# Patient Record
Sex: Female | Born: 1995 | Race: Black or African American | Hispanic: No | Marital: Single | Smoking: Current some day smoker
Health system: Southern US, Community
[De-identification: ages and names within clinical notes are randomized; demographics above are authoritative.]

## PROBLEM LIST (undated history)

## (undated) DIAGNOSIS — N739 Female pelvic inflammatory disease, unspecified: Secondary | ICD-10-CM

## (undated) DIAGNOSIS — B009 Herpesviral infection, unspecified: Secondary | ICD-10-CM

---
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
## 2017-11-06 ENCOUNTER — Other Ambulatory Visit: Payer: Self-pay

## 2017-11-06 ENCOUNTER — Emergency Department: Payer: 59

## 2017-11-06 ENCOUNTER — Encounter: Payer: Self-pay | Admitting: Emergency Medicine

## 2017-11-06 ENCOUNTER — Emergency Department
Admission: EM | Admit: 2017-11-06 | Discharge: 2017-11-06 | Disposition: A | Discharge status: Home / Self Care | Payer: 59 | Attending: Emergency Medicine | Admitting: Emergency Medicine

## 2017-11-06 DIAGNOSIS — R4182 Altered mental status, unspecified: Secondary | ICD-10-CM | POA: Insufficient documentation

## 2017-11-06 DIAGNOSIS — F1910 Other psychoactive substance abuse, uncomplicated: Secondary | ICD-10-CM

## 2017-11-06 DIAGNOSIS — F1210 Cannabis abuse, uncomplicated: Secondary | ICD-10-CM | POA: Insufficient documentation

## 2017-11-06 LAB — COMPREHENSIVE METABOLIC PANEL
ALK PHOS: 98 U/L (ref 38–126)
ALT: 14 U/L (ref 14–54)
ANION GAP: 6 (ref 5–15)
AST: 19 U/L (ref 15–41)
Albumin: 4.6 g/dL (ref 3.5–5.0)
BILIRUBIN TOTAL: 1.3 mg/dL — AB (ref 0.3–1.2)
BUN: 10 mg/dL (ref 6–20)
CALCIUM: 9.3 mg/dL (ref 8.9–10.3)
CO2: 26 mmol/L (ref 22–32)
Chloride: 108 mmol/L (ref 101–111)
Creatinine, Ser: 1.14 mg/dL — ABNORMAL HIGH (ref 0.44–1.00)
GLUCOSE: 116 mg/dL — AB (ref 65–99)
Potassium: 4.1 mmol/L (ref 3.5–5.1)
Sodium: 140 mmol/L (ref 135–145)
TOTAL PROTEIN: 7.9 g/dL (ref 6.5–8.1)

## 2017-11-06 LAB — CBC
HEMATOCRIT: 43.9 % (ref 35.0–47.0)
Hemoglobin: 14.7 g/dL (ref 12.0–16.0)
MCH: 29.6 pg (ref 26.0–34.0)
MCHC: 33.4 g/dL (ref 32.0–36.0)
MCV: 88.5 fL (ref 80.0–100.0)
PLATELETS: 232 10*3/uL (ref 150–440)
RBC: 4.97 MIL/uL (ref 3.80–5.20)
RDW: 14.1 % (ref 11.5–14.5)
WBC: 7.4 10*3/uL (ref 3.6–11.0)

## 2017-11-06 LAB — URINE DRUG SCREEN, QUALITATIVE (ARMC ONLY)
Amphetamines, Ur Screen: NOT DETECTED
BARBITURATES, UR SCREEN: NOT DETECTED
BENZODIAZEPINE, UR SCRN: NOT DETECTED
Cannabinoid 50 Ng, Ur ~~LOC~~: POSITIVE — AB
Cocaine Metabolite,Ur ~~LOC~~: POSITIVE — AB
MDMA (Ecstasy)Ur Screen: NOT DETECTED
METHADONE SCREEN, URINE: NOT DETECTED
Opiate, Ur Screen: NOT DETECTED
PHENCYCLIDINE (PCP) UR S: NOT DETECTED
Tricyclic, Ur Screen: NOT DETECTED

## 2017-11-06 LAB — MAGNESIUM: MAGNESIUM: 2.1 mg/dL (ref 1.7–2.4)

## 2017-11-06 LAB — ACETAMINOPHEN LEVEL

## 2017-11-06 LAB — HCG, QUANTITATIVE, PREGNANCY: hCG, Beta Chain, Quant, S: <1 m[IU]/mL (ref <5)

## 2017-11-06 LAB — ETHANOL

## 2017-11-06 LAB — POCT PREGNANCY, URINE: Preg Test, Ur: NEGATIVE

## 2017-11-06 LAB — SALICYLATE LEVEL: Salicylate Lvl: <7 mg/dL (ref 2.8–30.0)

## 2017-11-06 MED ORDER — SODIUM CHLORIDE 0.9 % IV BOLUS: 500.0000 mL | Freq: Once | INTRAVENOUS | Status: DC

## 2017-11-06 NOTE — ED Notes (Signed)
Pt sitting up eating a sandwich tray at this time. No signs of distress.

## 2017-11-06 NOTE — BH Assessment (Signed)
Writer unable to complete TTS Consult. Patient is too lethargic to participate.

## 2017-11-06 NOTE — ED Provider Notes (Signed)
[facility] Emergency Department Provider Note   ____________________________________________   First MD Initiated Contact with Patient 11/06/17 1547     (approximate)  I have reviewed the triage vital signs and the nursing notes.   HISTORY  Chief Complaint Altered Mental Status  EM caveat: Patient somnolent, seems to have poor recollection but is able to provide some history.  History is denoted as what she gives me  HPI Casey Underwood is a 22 y.o. female reports that for the last day she has been eating food that [place] marijuana laced in it.  She reports she was with her friends last evening, and then also this morning she ate food that her friends had baked with marijuana in it.  She also reports that she was drinking alcohol quite a bit last night.  Mom denies pregnancy.  Denies being in any pain.  Reports she just feels very tired.  She denies that she was trying to hurt herself.  Denies suicidal ideation.  Denies hearing voices.  She reports that she and her friends eat "edibles" in order to get high.  She did so this morning as well. Was in a rice krispy bar.  Past Medical History:  Diagnosis Date  . Pelvic inflammatory disease     There are no active problems to display for this patient.   History reviewed. No pertinent surgical history.  Prior to Admission medications   Medication Sig Start Date End Date Taking? Authorizing Provider  magic mouthwash w/lidocaine SOLN Take 5 mLs by mouth 4 (four) times daily. 12/31/15   Cuthriell, Delorise Royals, PA-C    Allergies Patient has no known allergies.  No family history on file.  Social History Social History   Tobacco Use  . Smoking status: Current Some Day Smoker  . Smokeless tobacco: Never Used  Substance Use Topics  . Alcohol use: Yes  . Drug use: Not on file    Review of Systems Constitutional: Denies recent illness.  Denies having a headache. Eyes: No visual changes. ENT: No sore  throat.  Denies neck pain Cardiovascular: Denies chest pain. Respiratory: Denies shortness of breath. Gastrointestinal: No abdominal pain.  Denies pregnancy. Genitourinary: Negative for dysuria. Musculoskeletal: No pain in any muscles or back. Skin: Negative for rash.  Denies itching. Neurological: Negative for headaches.  Reports that she does feel very tired and wants to sleep.   ____________________________________________   PHYSICAL EXAM:  VITAL SIGNS: ED Triage Vitals  Enc Vitals Group     BP 11/06/17 1417 125/79     Pulse Rate 11/06/17 1417 (!) 103     Resp 11/06/17 1417 18     Temp 11/06/17 1417 99 F (37.2 C)     Temp Source 11/06/17 1417 Oral     SpO2 11/06/17 1417 100 %     Weight 11/06/17 1418 108 lb (49 kg)     Height 11/06/17 1418 4\' 9"  (1.448 m)     Head Circumference --      Peak Flow --      Pain Score --      Pain Loc --      Pain Edu? --      Excl. in GC? --     Constitutional: Alert and oriented with an spoken to, but prefers to rest on her side and appears very fatigued and somnolent overall. Well appearing and in no acute distress. Eyes: Conjunctivae are normal. Head: Atraumatic. Nose: No congestion/rhinnorhea. Mouth/Throat: Mucous membranes are moist. Neck:  No stridor.   Cardiovascular: Normal rate, regular rhythm. Grossly normal heart sounds.  Good peripheral circulation. Respiratory: Normal respiratory effort.  No retractions. Lungs CTAB. Gastrointestinal: Soft and nontender. No distention.  Does not appear gravid. Musculoskeletal: No lower extremity tenderness nor edema.  No lacerations or injuries on extremities. Neurologic: Somewhat soft spoken.  Appears very tired.  No focal abnormalities.  No gross focal neurologic deficits are appreciated.  The patient does exhibit nystagmus when looking in lateral fields.  She also has a slight "roving" appearance of her eye movements when trying to focus forward. Skin:  Skin is warm, dry and intact. No  rash noted. Psychiatric: Mood and affect are calm and sedated.  Does not appear to be responding to external stimuli.  Denies desire to harm herself or anyone else.  She is very frank and reports that she was eating at a bolus to attempt to get high which she thought contained only marijuana.  She denies snorting, smoking or injection of any other medication except does report using alcohol last night as well. ____________________________________________   LABS (all labs ordered are listed, but only abnormal results are displayed)  Labs Reviewed  COMPREHENSIVE METABOLIC PANEL - Abnormal; Notable for the following components:      Result Value   Glucose, Bld 116 (*)    Creatinine, Ser 1.14 (*)    Total Bilirubin 1.3 (*)    All other components within normal limits  ACETAMINOPHEN LEVEL - Abnormal; Notable for the following components:   Acetaminophen (Tylenol), Serum <10 (*)    All other components within normal limits  URINE DRUG SCREEN, QUALITATIVE ([facility] ONLY) - Abnormal; Notable for the following components:   Cocaine Metabolite,Ur [place] POSITIVE (*)    Cannabinoid 50 Ng, Ur  POSITIVE (*)    All other components within normal limits  ETHANOL  SALICYLATE LEVEL  CBC  HCG, QUANTITATIVE, PREGNANCY  MAGNESIUM  URINALYSIS, COMPLETE (UACMP) WITH MICROSCOPIC  POC URINE PREG, ED  POCT PREGNANCY, URINE   ____________________________________________  EKG  Reviewed and entered by me at 1445 Heart rate 101 QRS 60 QTC 470 Sinus tachycardia, mild prolongation of the QT interval ____________________________________________  RADIOLOGY  CT the head negative for acute ____________________________________________   PROCEDURES  Procedure(s) performed: None  Procedures  Critical Care performed: No ____________________________________________   INITIAL IMPRESSION / ASSESSMENT AND PLAN / ED COURSE  Pertinent labs & imaging results that were available during my care of the patient  were reviewed by me and considered in my medical decision making (see chart for details).  Patient presents for altered mental status, somnolence after evidently taking edible brownie containing what she believed to be marijuana.  She is altered, somnolent, discussed in contact with poison control who advised observation for clearing mental status.  Her hemodynamics are stable, no distress, protecting her airway well, best described as somnolent, some abnormal extraocular movements likely attributable to use of can ambulate containing compound according to poison control reports the side effects can be seen especially in younger individuals using edible cannabinoids.  Unclear how the patient got cocaine in her system, she does not admit to cocaine use.  Exposed possible could be ingested, she does not know what exactly she ate in her edible brownie.    ----------------------------------------- 8:45 PM on 11/06/2017 -----------------------------------------  The patient is resting comfortably, has eaten a couple of sandwiches and chips.  She is fully alert and oriented.  Able to ambulate without difficulty.  She has normal ocular movements now,  speaks in full and fluent sentences with clear voice.  She understands and tells me that she knows that she should not be taking drugs, and she was doing so to get high with friends.  Denies any ongoing symptoms.  Appears appropriate for discharge, no evidence of ongoing intoxication.  Counseled patient, her mother is also at the bedside who the patient gave me permission to discuss with, and I advised them on the risks and dangers of using drugs or illegal substances.  Patient is agreeable to follow-up with residential treatment services.  Mother will be taking her home  Return precautions and treatment recommendations and follow-up discussed with the patient who is agreeable with the plan.   ____________________________________________   FINAL CLINICAL  IMPRESSION(S) / ED DIAGNOSES  Final diagnoses:  Polysubstance abuse (HCC)      NEW MEDICATIONS STARTED DURING THIS VISIT:  New Prescriptions   No medications on file     Note:  This document was prepared using Dragon voice recognition software and may include unintentional dictation errors.     Sharyn Creamer, MD 11/06/17 2050

## 2017-11-06 NOTE — ED Notes (Signed)
Mom at bedside asking for results. Pt verbalized permission to tell mom all of her test results. Spoke with pt about drugs and the dangers of such and to not squander her opportunity. Pt continues to drink fluid.

## 2017-11-06 NOTE — ED Triage Notes (Signed)
FIRST NURSE NOTE-mom here with pt.  Mom wants pt admitted because she ate a rice krispie treat with weed in it.  Mom does not like friend she hangs out with and feels like her self esteem needs to be increased.  NAD. No vomiting or side effects from food.

## 2017-11-06 NOTE — ED Notes (Signed)
Per Revonda StandardAllison at poison control, talk to mother about any medications in the home that anyone at home takes.  Otherwise, "labs are unimpressive".  Poison control staff was relieved that salicylate levels were wnl.  Edible marijuana's can be stronger and can cause some lethargy and confusion.  Horizontal and Vertical nystagmus in pediatric patients has been recorded.  If patient doesn't improve, we can call poison control back.

## 2017-11-06 NOTE — ED Notes (Signed)
While getting VS, pt mom states pt "needs time away from everyone so she can think about herself and her son."

## 2017-11-06 NOTE — ED Notes (Signed)
Mom is dropping of the pt, gave a phone number to call once daughter is being discharged. [phone] Moms name: Gabriel RungMonique Bookercrisp

## 2017-11-06 NOTE — ED Triage Notes (Signed)
Mom says patient went out this am, and she had eaten a rice crispie treat.  Mom is concerned that there was something more than weed in it, as patient is acting strange.  Pt is awake to verbal, but she closes her eyes a lot in traige.

## 2017-11-06 NOTE — ED Notes (Signed)
Pt given dinner tray at this time, pt up and eating with NAD noted

## 2017-11-06 NOTE — ED Notes (Signed)
Md states he does want to watch pt for a few more hours. Pt is alert but still high. Pt has eaten multiple times, drinking PO fluids and her VSS.

## 2017-11-06 NOTE — ED Notes (Signed)
PT is refusing IV, MD awre

## 2017-11-06 NOTE — ED Notes (Signed)
This RN called poison control at this time.

## 2017-11-06 NOTE — ED Notes (Addendum)
Patient states she ate several rice crispies with "weed butter" and since then has felt like she is "drowning."  Patient's eyes are wide and patient appears tired.  Patient denies SI and HI.  Patient denies fear of anyone hurting her.

## 2017-11-06 NOTE — Discharge Instructions (Signed)

## 2017-11-06 NOTE — ED Notes (Addendum)
Pt continues to sleep, NSR on monitor. Pt responds when stimulated, VSS . MD awre

## 2018-04-08 ENCOUNTER — Emergency Department
Admission: EM | Admit: 2018-04-08 | Discharge: 2018-04-08 | Disposition: A | Discharge status: Home / Self Care | Payer: Medicaid Other | Attending: Emergency Medicine | Admitting: Emergency Medicine

## 2018-04-08 ENCOUNTER — Other Ambulatory Visit: Payer: Self-pay

## 2018-04-08 DIAGNOSIS — F17210 Nicotine dependence, cigarettes, uncomplicated: Secondary | ICD-10-CM | POA: Insufficient documentation

## 2018-04-08 DIAGNOSIS — R21 Rash and other nonspecific skin eruption: Secondary | ICD-10-CM

## 2018-04-08 LAB — BASIC METABOLIC PANEL
Anion gap: 7 (ref 5–15)
BUN: 17 mg/dL (ref 6–20)
CALCIUM: 9.1 mg/dL (ref 8.9–10.3)
CO2: 25 mmol/L (ref 22–32)
CREATININE: 1.16 mg/dL — AB (ref 0.44–1.00)
Chloride: 107 mmol/L (ref 98–111)
Glucose, Bld: 78 mg/dL (ref 70–99)
Potassium: 3.4 mmol/L — ABNORMAL LOW (ref 3.5–5.1)
SODIUM: 139 mmol/L (ref 135–145)

## 2018-04-08 LAB — WET PREP, GENITAL
Clue Cells Wet Prep HPF POC: NONE SEEN
SPERM: NONE SEEN
TRICH WET PREP: NONE SEEN
YEAST WET PREP: NONE SEEN

## 2018-04-08 LAB — CBC WITH DIFFERENTIAL/PLATELET
BASOS PCT: 1 %
Basophils Absolute: 0.1 10*3/uL (ref 0–0.1)
EOS PCT: 5 %
Eosinophils Absolute: 0.3 10*3/uL (ref 0–0.7)
HCT: 43.4 % (ref 35.0–47.0)
Hemoglobin: 14.8 g/dL (ref 12.0–16.0)
LYMPHS ABS: 2.8 10*3/uL (ref 1.0–3.6)
Lymphocytes Relative: 39 %
MCH: 30.3 pg (ref 26.0–34.0)
MCHC: 34.2 g/dL (ref 32.0–36.0)
MCV: 88.5 fL (ref 80.0–100.0)
MONOS PCT: 11 %
Monocytes Absolute: 0.8 10*3/uL (ref 0.2–0.9)
NEUTROS PCT: 44 %
Neutro Abs: 3.3 10*3/uL (ref 1.4–6.5)
PLATELETS: 224 10*3/uL (ref 150–440)
RBC: 4.9 MIL/uL (ref 3.80–5.20)
RDW: 13.6 % (ref 11.5–14.5)
WBC: 7.2 10*3/uL (ref 3.6–11.0)

## 2018-04-08 LAB — URINALYSIS, COMPLETE (UACMP) WITH MICROSCOPIC
BACTERIA UA: NONE SEEN
Bilirubin Urine: NEGATIVE
Glucose, UA: NEGATIVE mg/dL
Hgb urine dipstick: NEGATIVE
Ketones, ur: 5 mg/dL — AB
Leukocytes, UA: NEGATIVE
Nitrite: NEGATIVE
PROTEIN: 30 mg/dL — AB
SPECIFIC GRAVITY, URINE: 1.029 (ref 1.005–1.030)
pH: 5 (ref 5.0–8.0)

## 2018-04-08 LAB — CHLAMYDIA/NGC RT PCR (ARMC ONLY)
Chlamydia Tr: DETECTED — AB
N gonorrhoeae: DETECTED — AB

## 2018-04-08 LAB — RAPID HIV SCREEN (HIV 1/2 AB+AG)
HIV 1/2 ANTIBODIES: NONREACTIVE
HIV-1 P24 ANTIGEN - HIV24: NONREACTIVE

## 2018-04-08 LAB — PREGNANCY, URINE: Preg Test, Ur: NEGATIVE

## 2018-04-08 MED ORDER — DOXYCYCLINE HYCLATE 100 MG PO CAPS: 100.0000 mg | ORAL_CAPSULE | Freq: Two times a day (BID) | ORAL | 0 refills | Status: AC

## 2018-04-08 MED ORDER — DOXYCYCLINE HYCLATE 100 MG PO TABS
100.0000 mg | ORAL_TABLET | Freq: Once | ORAL | Status: AC
  Administered 2018-04-08: 100 mg via ORAL
  Filled 2018-04-08: qty 1

## 2018-04-08 NOTE — ED Triage Notes (Signed)
Patient reports she has this burning, picking sensation on her skin and unexplained sores over her body.  Patient states she can rub her skin and this "substance" come out.

## 2018-04-08 NOTE — ED Provider Notes (Signed)
[facility] Emergency Department Provider Note ____________________________________________   First MD Initiated Contact with Patient 04/08/18 2039     (approximate)  I have reviewed the triage vital signs and the nursing notes.   HISTORY  Chief Complaint Unknown "sores"  HPI Casey Underwood is a 22 y.o. 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  She is unsure of when the rash started exactly.  However, she says that it is diffusely to her body, worse to the right posterior thigh.  Says that it feels like things are "crawling all over her and she is seeing "white things" in her ear on her head as well as her pubic hair.  Denies any itching or pain.  Says that she is also having vaginal discharge.  Says that she was treated for herpes outbreak [facility] January [facility] this [facility] and says that she did not test positive for any other STDs at that time.  However, she says that she has had new sexual partner since.   Past Medical History:  Diagnosis Date  . Pelvic inflammatory disease     There are no active problems to display for this patient.   No past surgical history on file.  Prior to Admission medications   Not on File    Allergies Patient has no known allergies.  No family history on file.  Social History Social History   Tobacco Use  . Smoking status: Current Some Day Smoker  . Smokeless tobacco: Never Used  Substance Use Topics  . Alcohol use: Yes  . Drug use: Not on file    Review of Systems  Constitutional: No fever/chills Eyes: No visual changes. ENT: No sore throat. Cardiovascular: Denies chest pain. Respiratory: Denies shortness of breath. Gastrointestinal: No abdominal pain.  No nausea, no vomiting.  No diarrhea.  No constipation. Genitourinary: Negative for dysuria. Musculoskeletal: Negative for back  pain. Skin: As above Neurological: Negative for headaches, focal weakness or numbness.   ____________________________________________   PHYSICAL EXAM:  VITAL SIGNS: ED Triage Vitals [04/08/18 2013]  Enc Vitals Group     BP 106/73     Pulse Rate (!) 103     Resp 18     Temp 97.9 F (36.6 C)     Temp Source Oral     SpO2 100 %     Weight 98 lb (44.5 kg)     Height 4\' 9"  (1.448 m)     Head Circumference      Peak Flow      Pain Score      Pain Loc      Pain Edu?      Excl. in GC?     Constitutional: Alert and oriented.  Patient tearful.  Says that she is nervous. Eyes: Conjunctivae are normal.  Head: Atraumatic.  I do not see any nits or bite marks to the posterior neck.  No swollen posterior nodes to the neck.  I do not see the small weight flecks of the patient was referring to on her scalp. Nose: No congestion/rhinnorhea. Mouth/Throat: Mucous membranes are moist.  No intraoral lesions. Neck: No stridor.   Cardiovascular: Normal rate, regular rhythm. Grossly normal heart sounds.  Heart rate 92 bpm in the room. Respiratory: Normal respiratory effort.  No retractions. Lungs CTAB. Gastrointestinal: Soft and nontender. No distention. Genitourinary: Several small, 0.5 to  1 cm ulcerated lesions without any vesicles on the perineum.  No surrounding erythema or induration.  Speculum exam with a very small amount of white discharge.  Bimanual exam without CMT, uterine or nor adnexal tenderness nor masses. Musculoskeletal: No lower extremity tenderness nor edema.  No joint effusions. Neurologic:  Normal speech and language. No gross focal neurologic deficits are appreciated. Skin: Multiple 0.5 to 1 cm lesions that appear to be unroofed with a small amount of shiny, subdermal tissue in the middle of a keratinized ring.  There is no exudate.  No excoriations.  No lesions on the palms and soles but the patient does say that she noticed lesions on the palms previously. Psychiatric: Mood  and affect are normal. Speech and behavior are normal.  ____________________________________________   LABS (all labs ordered are listed, but only abnormal results are displayed)  Labs Reviewed  WET PREP, GENITAL - Abnormal; Notable for the following components:      Result Value   WBC, Wet Prep HPF POC FEW (*)    All other components within normal limits  BASIC METABOLIC PANEL - Abnormal; Notable for the following components:   Potassium 3.4 (*)    Creatinine, Ser 1.16 (*)    All other components within normal limits  URINALYSIS, COMPLETE (UACMP) WITH MICROSCOPIC - Abnormal; Notable for the following components:   Color, Urine YELLOW (*)    APPearance HAZY (*)    Ketones, ur 5 (*)    Protein, ur 30 (*)    All other components within normal limits  CHLAMYDIA/NGC RT PCR ([facility] ONLY)  CBC WITH DIFFERENTIAL/PLATELET  RAPID HIV SCREEN (HIV 1/2 AB+AG)  PREGNANCY, URINE  RPR  POC URINE PREG, ED   ____________________________________________  EKG   ____________________________________________  RADIOLOGY   ____________________________________________   PROCEDURES  Procedure(s) performed:   Procedures  Critical Care performed:   ____________________________________________   INITIAL IMPRESSION / ASSESSMENT AND PLAN / ED COURSE  Pertinent labs & imaging results that were available during my care of the patient were reviewed by me and considered in my medical decision making (see chart for details).  DDX: Secondary syphilis, folliculitis, disseminated herpes, HIV, cervicitis, trichomonas As part of my medical decision making, I reviewed the following data within the electronic MEDICAL RECORD NUMBER Notes from prior ED visits  ----------------------------------------- 11:03 PM on 04/08/2018 -----------------------------------------  Patient at this time resting without any distress.  RPR still pending but the rest of the work-up is reassuring with negative HIV  testing on the rapid tests.  Normal CBC.  Gonorrhea and Chlamydia are pending but there are only few white blood cells on the wet prep.  Possibly secondary syphilis versus an impetigo versus folliculitis.  Patient will be treated with doxycycline which will cover multiple pathologies including those listed.  Patient understands follow-up with primary care.  She is understanding that her syphilis test is still pending.  Will be discharged at this time. ____________________________________________   FINAL CLINICAL IMPRESSION(S) / ED DIAGNOSES  Rash.  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  NEW MEDICATIONS STARTED DURING THIS VISIT:  New Prescriptions   No medications on file     Note:  This document was prepared using Dragon voice recognition software and may include unintentional dictation errors.     Myrna Blazer, MD 04/08/18 [phone]

## 2018-04-08 NOTE — ED Notes (Signed)
Pt with lesions all over body.

## 2018-04-09 ENCOUNTER — Telehealth: Payer: Self-pay | Admitting: Emergency Medicine

## 2018-04-09 LAB — RPR: RPR: NONREACTIVE

## 2018-04-09 NOTE — Telephone Encounter (Addendum)
Called patient to inform of std test results and need for further treatment.  She has positive gonorrhea and chlamydia and needs to return here today for treatment.  Left message. If patient does not call back today will try again tomorrow, but she could be treated at [facility] which will be open tomorrow.  [phone])  Patient called me back.  Says she will go to [facility] now for treatment, but is more concerned about rash.  She will see if she can go to dermatologist.

## 2018-04-17 ENCOUNTER — Emergency Department
Admission: EM | Admit: 2018-04-17 | Discharge: 2018-04-17 | Disposition: A | Discharge status: Home / Self Care | Payer: Medicaid Other | Attending: Emergency Medicine | Admitting: Emergency Medicine

## 2018-04-17 ENCOUNTER — Other Ambulatory Visit: Payer: Self-pay

## 2018-04-17 ENCOUNTER — Encounter: Payer: Self-pay | Admitting: Emergency Medicine

## 2018-04-17 DIAGNOSIS — B853 Phthiriasis: Secondary | ICD-10-CM | POA: Insufficient documentation

## 2018-04-17 DIAGNOSIS — F1410 Cocaine abuse, uncomplicated: Secondary | ICD-10-CM | POA: Insufficient documentation

## 2018-04-17 DIAGNOSIS — F17210 Nicotine dependence, cigarettes, uncomplicated: Secondary | ICD-10-CM | POA: Insufficient documentation

## 2018-04-17 DIAGNOSIS — L988 Other specified disorders of the skin and subcutaneous tissue: Secondary | ICD-10-CM | POA: Insufficient documentation

## 2018-04-17 HISTORY — DX: Herpesviral infection, unspecified: B00.9

## 2018-04-17 MED ORDER — PERMETHRIN 1 % EX LOTN: TOPICAL_LOTION | CUTANEOUS | 0 refills | Status: AC

## 2018-04-17 MED ORDER — PERMETHRIN 5 % EX CREA: 1.0000 "application " | TOPICAL_CREAM | Freq: Once | CUTANEOUS | 0 refills | Status: AC

## 2018-04-17 MED ORDER — CETIRIZINE HCL 10 MG PO TABS: 10.0000 mg | ORAL_TABLET | Freq: Four times a day (QID) | ORAL | 0 refills | Status: DC | PRN

## 2018-04-17 NOTE — ED Provider Notes (Signed)
[facility] Emergency Department Provider Note  ____________________________________________   First MD Initiated Contact with Patient 04/17/18 [phone]     (approximate)  I have reviewed the triage vital signs and the nursing notes.   HISTORY  Chief Complaint Pruritis and Insect Bite   HPI Casey Underwood is a 22 y.o. female who comes to the emergency department with several weeks of diffuse lesions across her arms legs abdomen and chest.  They are intensely itchy.  She was recently seen in our emergency department where she was diagnosed with both gonorrhea and chlamydia and completed a complete course of antibiotics for both.  Her symptoms are unchanged but persistent.  She is concerned because she has found "worms" when she scratches her skin.  She also reports a long-standing history of cocaine abuse although denies recent use.    Past Medical History:  Diagnosis Date  . Herpes simplex   . Pelvic inflammatory disease     There are no active problems to display for this patient.   Past Surgical History:  Procedure Laterality Date  . VAGINAL DELIVERY      Prior to Admission medications   Medication Sig Start Date End Date Taking? Authorizing Provider  cetirizine (ZYRTEC ALLERGY) 10 MG tablet Take 1 tablet (10 mg total) by mouth 4 (four) times daily as needed (itching). 04/17/18   Merrily Brittle, MD  doxycycline (VIBRAMYCIN) 100 MG capsule Take 1 capsule (100 mg total) by mouth 2 (two) times daily for 14 days. 04/08/18 04/22/18  Myrna Blazer, MD  permethrin (PERMETHRIN LICE TREATMENT) 1 % lotion Apply to affected area once 04/17/18 04/16/19  Merrily Brittle, MD    Allergies Patient has no known allergies.  No family history on file.  Social History Social History   Tobacco Use  . Smoking status: Current Some Day Smoker    Types: Cigarettes  . Smokeless tobacco: Never Used  Substance Use Topics  . Alcohol use: Yes  . Drug use: Never      Review of Systems Constitutional: No fever/chills ENT: No sore throat. Cardiovascular: Denies chest pain. Respiratory: Denies shortness of breath. Gastrointestinal: No abdominal pain.  No nausea, no vomiting.   Skin: Positive for rash Neurological: Negative for headaches   ____________________________________________   PHYSICAL EXAM:  VITAL SIGNS: ED Triage Vitals  Enc Vitals Group     BP 04/17/18 0118 119/75     Pulse Rate 04/17/18 0118 (!) 105     Resp 04/17/18 0118 19     Temp 04/17/18 0118 98.9 F (37.2 C)     Temp Source 04/17/18 0118 Oral     SpO2 04/17/18 0118 100 %     Weight 04/17/18 0118 100 lb (45.4 kg)     Height 04/17/18 0118 4\' 9"  (1.448 m)     Head Circumference --      Peak Flow --      Pain Score 04/17/18 0136 5     Pain Loc --      Pain Edu? --      Excl. in GC? --     Constitutional: Fidgeting and appears somewhat hyperactive and uncomfortable Head: Atraumatic.  Pupils slightly dilated Nose: No congestion/rhinnorhea. Mouth/Throat: No trismus Neck: No stridor.   Cardiovascular: Regular rate and rhythm Respiratory: Normal respiratory effort.  No retractions. Neurologic:   No gross focal neurologic deficits are appreciated.  Skin: Excoriations and pick marks across arms legs chest and abdomen.  No overlying cellulitis noted  ____________________________________________  LABS (all labs ordered are listed, but only abnormal results are displayed)  Labs Reviewed - No data to display   __________________________________________  EKG   ____________________________________________  RADIOLOGY   ____________________________________________   DIFFERENTIAL includes but not limited to  Morgellon syndrome, delusional parasitosis, scabies, cellulitis   PROCEDURES  Procedure(s) performed: no  Procedures  Critical Care performed: no  ____________________________________________   INITIAL IMPRESSION / ASSESSMENT AND PLAN / ED  COURSE  Pertinent labs & imaging results that were available during my care of the patient were reviewed by me and considered in my medical decision making (see chart for details).   As part of my medical decision making, I reviewed the following data within the electronic MEDICAL RECORD NUMBER History obtained from family if available, nursing notes, old chart and ekg, as well as notes from prior ED visits.  The patient has diffuse marks across her arms legs chest and back which are consistent with frequent picking possibly from cocaine or amphetamine abuse.  She brings in what she describes as a dead worm that she picked out of her skin however when she does it to me it is clearly a flake of skin.  Concerned that she may have delusional parasitosis and explained it to her.  I think is reasonable to treat her with antihistamines for symptomatic relief and have advised her to stop using illicit drugs.  Shortly prior to discharge her mother showed up and said that the patient has actually also had issues with pubic lice and is requesting treatment so I will add on permethrin.      ____________________________________________   FINAL CLINICAL IMPRESSION(S) / ED DIAGNOSES  Final diagnoses:  Morgellons syndrome  Cocaine abuse (HCC)  Pubic lice      NEW MEDICATIONS STARTED DURING THIS VISIT:  Discharge Medication List as of 04/17/2018  5:07 AM    START taking these medications   Details  cetirizine (ZYRTEC ALLERGY) 10 MG tablet Take 1 tablet (10 mg total) by mouth 4 (four) times daily as needed (itching)., Starting Tue 04/17/2018, Print    permethrin (ELIMITE) 5 % cream Apply 1 application topically once for 1 dose., Starting Tue 04/17/2018, Print    permethrin (PERMETHRIN LICE TREATMENT) 1 % lotion Apply to affected area once, Print         Note:  This document was prepared using Dragon voice recognition software and may include unintentional dictation errors.      Merrily Brittle,  MD 04/20/18 [phone]

## 2018-04-17 NOTE — ED Triage Notes (Signed)
Pt presents to ED with c/o feeling like things are popping outside of her skin and crawling around and biting inside and outside her skin. Pt states she felt something wet on the left side of her face prior to arrival and when she wiped her face she saw a worm that had just come out of her skin. Pt seen for the same in this ED and was told "it was in her head". Pt states something is "really in me and this just confirms it". Pt reports symptoms only seem to happen at night. Pt tearful in triage.

## 2018-04-17 NOTE — Discharge Instructions (Addendum)
Please take cetirizine 10 mg by mouth up to 4 times a day as needed for itching and refrain from using cocaine as the lesions on your skin seem to be related to picking.  Please follow-up with primary care as needed and return to the emergency department for any concerns.  It was a pleasure to take care of you today, and thank you for coming to our emergency department.  If you have any questions or concerns before leaving please ask the nurse to grab me and I'm more than happy to go through your aftercare instructions again.  If you have any concerns once you are home that you are not improving or are in fact getting worse before you can make it to your follow-up appointment, please do not hesitate to call 911 and come back for further evaluation.  Merrily Brittle, MD

## 2018-04-29 ENCOUNTER — Encounter: Payer: Self-pay | Admitting: Emergency Medicine

## 2018-04-29 ENCOUNTER — Emergency Department
Admission: EM | Admit: 2018-04-29 | Discharge: 2018-04-30 | Disposition: A | Discharge status: Home / Self Care | Payer: Self-pay | Attending: Emergency Medicine | Admitting: Emergency Medicine

## 2018-04-29 DIAGNOSIS — F17210 Nicotine dependence, cigarettes, uncomplicated: Secondary | ICD-10-CM | POA: Insufficient documentation

## 2018-04-29 DIAGNOSIS — K625 Hemorrhage of anus and rectum: Secondary | ICD-10-CM

## 2018-04-29 DIAGNOSIS — L281 Prurigo nodularis: Secondary | ICD-10-CM

## 2018-04-29 NOTE — ED Notes (Signed)
Assessment: pt states she has had itching to skin for 6 months. Pt states she thought it was related to the crack cocaine she has been using. Pt states she stopped taking crack cocaine 20 days ago but itching continues. Pt also states she is having rectal bleeding. Pt offering to show staff a video "of me pooping the blood out". Pt instructed to change into a gown for assessment by md.

## 2018-04-29 NOTE — ED Notes (Signed)
Rash   All  Over  Body  Seen 2  Weeks  Ago    Pt was   Seen   sev weeks  For std  Pt took  Her meds   Rash   Itches   Pt taking  Valtrex  And  Doxycyline   Pt  Wants  Help  With  Possible drug withdrawels pt having stress  Wants help

## 2018-04-29 NOTE — ED Triage Notes (Signed)
Patient states that for two months she has had a rash with itching all over. Patient states that it is becoming worse. Patient states that she was told that it might be scabies.

## 2018-04-29 NOTE — ED Notes (Signed)
Pt has multiple  Complaints   Pt needs to be  Moved to  Main ed    Per charge    nurse

## 2018-04-30 MED ORDER — PREDNISONE 10 MG PO TABS: ORAL_TABLET | ORAL | 0 refills | Status: DC

## 2018-04-30 MED ORDER — DOCUSATE SODIUM 100 MG PO CAPS: ORAL_CAPSULE | ORAL | 1 refills | Status: DC

## 2018-04-30 MED ORDER — HYDROXYZINE HCL 25 MG PO TABS: 25.0000 mg | ORAL_TABLET | Freq: Three times a day (TID) | ORAL | 0 refills | Status: DC | PRN

## 2018-04-30 MED ORDER — DOXEPIN HCL 10 MG PO CAPS: 10.0000 mg | ORAL_CAPSULE | Freq: Every evening | ORAL | 1 refills | Status: DC | PRN

## 2018-04-30 NOTE — ED Provider Notes (Signed)
[facility] Emergency Department Provider Note  ____________________________________________   First MD Initiated Contact with Patient 04/29/18 2337     (approximate)  I have reviewed the triage vital signs and the nursing notes.   HISTORY  Chief Complaint Rash    HPI Casey Underwood is a 22 y.o. female with medical history as listed below which includes addiction to crack cocaine, recent treatment for PID, possible pubic lice, and morgellons syndrome who presents for evaluation of a couple of issues.  She reports persistent lesions on all surfaces of her body.  She insists that she does not pick or scratch the lesions but it has been reported by a 1 of the doctors on a prior emergency department visit that she was observed picking at her skin and creating lesions thought to be secondary to her crack cocaine abuse and/or possible Morgellons syndrome.  She has also seen a dermatologist who prescribed her several different medications but she reports that she was unable to fill any of the prescriptions.  The symptoms have persisted and are severe.  However her main reason for presenting tonight is that she has been seeing blood in her stool.  She reports that for about a week she has been intermittently seeing blood in her stool.  She does not have blood when she is not having a bowel movement but she reports having a lot of blood in the stool when she does.  She describes it as bright red.  There is no associated pain.  She is certain it is not vaginal bleeding since she does not have regular periods secondary to an implanted contraceptive device.  She says that nothing in particular makes rectal bleeding better or worse.  She denies abdominal pain, chest pain, shortness of breath, fever/chills, nausea, vomiting, and diarrhea.  She does have chronic constipation.  Past Medical History:  Diagnosis Date  . Herpes simplex   . Pelvic inflammatory disease     There are  no active problems to display for this patient.   Past Surgical History:  Procedure Laterality Date  . VAGINAL DELIVERY      Prior to Admission medications   Medication Sig Start Date End Date Taking? Authorizing Provider  cetirizine (ZYRTEC ALLERGY) 10 MG tablet Take 1 tablet (10 mg total) by mouth 4 (four) times daily as needed (itching). 04/17/18   Merrily Brittleifenbark, Neil, MD  docusate sodium (COLACE) 100 MG capsule Take 1 tablet once or twice daily as needed for constipation 04/30/18   Loleta RoseForbach, Searra Carnathan, MD  doxepin (SINEQUAN) 10 MG capsule Take 1 capsule (10 mg total) by mouth at bedtime as needed (itching). 04/30/18 05/30/18  Loleta RoseForbach, Kalieb Freeland, MD  hydrOXYzine (ATARAX/VISTARIL) 25 MG tablet Take 1 tablet (25 mg total) by mouth 3 (three) times daily as needed for itching. 04/30/18   Loleta RoseForbach, Shakeria Robinette, MD  permethrin (PERMETHRIN LICE TREATMENT) 1 % lotion Apply to affected area once 04/17/18 04/16/19  Merrily Brittleifenbark, Neil, MD  predniSONE (DELTASONE) 10 MG tablet Take 4 tabs (40 mg) PO x 3 days, then take 2 tabs (20 mg) PO x 3 days, then take 1 tab (10 mg) PO x 3 days, then take 1/2 tab (5 mg) PO x 4 days. 04/30/18   Loleta RoseForbach, Earnestine Shipp, MD    Allergies Patient has no known allergies.  No family history on file.  Social History Social History   Tobacco Use  . Smoking status: Current Some Day Smoker    Types: Cigarettes  . Smokeless tobacco: Never  Used  Substance Use Topics  . Alcohol use: Yes  . Drug use: Never    Review of Systems Constitutional: No fever/chills Eyes: No visual changes. ENT: No sore throat. Cardiovascular: Denies chest pain. Respiratory: Denies shortness of breath. Gastrointestinal: BRBPR in stool. No abdominal pain.  No nausea, no vomiting.  No diarrhea.  Chronic constipation. Genitourinary: Negative for dysuria. Musculoskeletal: Negative for neck pain.  Negative for back pain. Integumentary: chronic lesions/rash as described above Neurological: Negative for headaches, focal weakness or  numbness.   ____________________________________________   PHYSICAL EXAM:  VITAL SIGNS: ED Triage Vitals [04/29/18 2155]  Enc Vitals Group     BP (!) 116/91     Pulse Rate 83     Resp 18     Temp 98.4 F (36.9 C)     Temp Source Oral     SpO2 100 %     Weight 45.4 kg (100 lb)     Height 1.448 m (4\' 9" )     Head Circumference      Peak Flow      Pain Score 0     Pain Loc      Pain Edu?      Excl. in GC?     Constitutional: Alert and oriented. Generally well appearing and in no acute distress. Eyes: Conjunctivae are normal.  Head: Atraumatic. Nose: No congestion/rhinnorhea. Mouth/Throat: Mucous membranes are moist. No oral lesions. Neck: No stridor.  No meningeal signs.   Cardiovascular: Normal rate, regular rhythm. Good peripheral circulation. Grossly normal heart sounds. Respiratory: Normal respiratory effort.  No retractions. Lungs CTAB. Gastrointestinal: Soft and nontender. No distention.  Rectal: Normal external exam with no evidence of external hemorrhoids.  Minimal light brown stool present in the rectal vault, Hemoccult negative.  ED chaperone present throughout exam.   Musculoskeletal: No lower extremity tenderness nor edema. No gross deformities of extremities. Neurologic:  Normal speech and language. No gross focal neurologic deficits are appreciated.  Skin:  Skin is warm, dry and intact.  No evidence of cellulitis, crusting or weeping lesions.  She has lichenified papules on most of the external body surface area that appear consistent with lesions that have been excoriated and then begun the healing process.  No involvement of the palms nor soles nor oral mucosa. Psychiatric: Mood and affect are normal. Speech and behavior are normal.  ____________________________________________   LABS (all labs ordered are listed, but only abnormal results are displayed)  Labs Reviewed - No data to display ____________________________________________  EKG  None - EKG  not ordered by ED physician ____________________________________________  RADIOLOGY   ED MD interpretation: No indication for imaging  Official radiology report(s): No results found.  ____________________________________________   PROCEDURES  Critical Care performed: No   Procedure(s) performed:   Procedures   ____________________________________________   INITIAL IMPRESSION / ASSESSMENT AND PLAN / ED COURSE  As part of my medical decision making, I reviewed the following data within the electronic MEDICAL RECORD NUMBER Nursing notes reviewed and incorporated, Labs reviewed , Old chart reviewed and Notes from prior ED visits    I reviewed the past medical record including the dermatology note which diagnosed her with Prurigo nodularis.  She has no signs of active infection.  I checked with [facility] and verified that 1 of the medications prescribed by the dermatologist is extremely expensive but the doxepin is actually quite affordable.  I provided a prescription for that as well as for prednisone and Atarax and attempt to get her some  relief from her itching.  I gave her a [employer] card as well.  I encouraged follow-up with dermatology.  She is not having any pain from her rectal bleeding and although she did show me a picture of the blood in the commode, she currently has no blood in her stool including a negative Hemoccult.  She has no tenderness to palpation of the abdomen.  There is no indication for further work-up at this time and I explained to her about the possibility of internal hemorrhoids particularly in the setting of chronic constipation.  I gave her a prescription for Colace and encouraged outpatient follow-up with GI.  She says that she understands and agrees with the plan.     ____________________________________________  FINAL CLINICAL IMPRESSION(S) / ED DIAGNOSES  Final diagnoses:  Prurigo nodularis  Rectal bleeding     MEDICATIONS GIVEN DURING THIS  VISIT:  Medications - No data to display   ED Discharge Orders         Ordered    doxepin (SINEQUAN) 10 MG capsule  At bedtime PRN     04/30/18 0018    predniSONE (DELTASONE) 10 MG tablet     04/30/18 0018    docusate sodium (COLACE) 100 MG capsule     04/30/18 0018    hydrOXYzine (ATARAX/VISTARIL) 25 MG tablet  3 times daily PRN     04/30/18 0018           Note:  This document was prepared using Dragon voice recognition software and may include unintentional dictation errors.    Loleta Rose, MD 04/30/18 [phone]

## 2018-04-30 NOTE — ED Notes (Signed)
Assisted md with rectal exam.  

## 2018-04-30 NOTE — Discharge Instructions (Signed)
As we discussed, your evaluation was reassuring today.  We understand you have had blood in your stool, but there was no blood on exam today.  Most likely you have an internal hemorrhoid that bleeds occasionally when you have a bowel movement.  You can help this by trying not to bear down and strain hard when you have a bowel movement, but we also recommend you take a stool softener such as docusate (Colace) or MiraLAX.  I gave you follow-up information with a GI doctor that you can see if you are continue to have problems with blood in your stool; they can do test such as a colonoscopy which is a camera that looks inside your colon to look for sources of bleeding.  I also provided you with a prescription for doxepin, which is 1 of the medications previously prescribed to you by your dermatologist.  It is relatively inexpensive (about $12) and should help with your symptoms.  I also provided prescriptions for a medication called hydroxyzine which helps with itching and a medication called prednisone which may help with the itching and inflammation as a result of the lesions.  You were given a card that you can take to the pharmacy which should get to the best possible price.  We recommend you try [facility] because they typically have the best prices.

## 2018-06-04 ENCOUNTER — Encounter: Payer: Self-pay | Admitting: Intensive Care

## 2018-06-04 ENCOUNTER — Emergency Department
Admission: EM | Admit: 2018-06-04 | Discharge: 2018-06-04 | Disposition: A | Discharge status: Home / Self Care | Payer: Medicaid Other | Attending: Emergency Medicine | Admitting: Emergency Medicine

## 2018-06-04 ENCOUNTER — Other Ambulatory Visit: Payer: Self-pay

## 2018-06-04 DIAGNOSIS — F17290 Nicotine dependence, other tobacco product, uncomplicated: Secondary | ICD-10-CM | POA: Insufficient documentation

## 2018-06-04 DIAGNOSIS — J029 Acute pharyngitis, unspecified: Secondary | ICD-10-CM | POA: Insufficient documentation

## 2018-06-04 DIAGNOSIS — M79672 Pain in left foot: Secondary | ICD-10-CM | POA: Insufficient documentation

## 2018-06-04 LAB — GROUP A STREP BY PCR: GROUP A STREP BY PCR: NOT DETECTED

## 2018-06-04 MED ORDER — CYCLOBENZAPRINE HCL 5 MG PO TABS: 5.0000 mg | ORAL_TABLET | Freq: Three times a day (TID) | ORAL | 0 refills | Status: DC | PRN

## 2018-06-04 MED ORDER — NABUMETONE 750 MG PO TABS: 750.0000 mg | ORAL_TABLET | Freq: Two times a day (BID) | ORAL | 0 refills | Status: DC

## 2018-06-04 NOTE — ED Triage Notes (Addendum)
PAtient c/o sore throat and body aches X1 week

## 2018-06-04 NOTE — Discharge Instructions (Signed)
Take the prescription meds as directed. Follow-up with [facility] for routine care and Dr. Orland Jarred for continued foot pain.

## 2018-06-04 NOTE — ED Provider Notes (Signed)
[facility] Emergency Department Provider Note ____________________________________________  Time seen: 1836  I have reviewed the triage vital signs and the nursing notes.  HISTORY  Chief Complaint  Sore Throat  HPI Casey Underwood is a 22 y.o. female presents to the ED for evaluation of a one-week complaint of sore throat.  Patient denies any interim fevers, chills, sweats patient also denies any sick contacts, recent travel, or other exposures.  Patient also has a secondary, unrelated complaint of left foot pain.  She denies any recent injury swelling, edema, or disability to the left foot or ankle.  She describes pins-and-needles primarily to the foot plantar surface when she wakens in the morning.  Past Medical History:  Diagnosis Date  . Herpes simplex   . Pelvic inflammatory disease    There are no active problems to display for this patient.  Past Surgical History:  Procedure Laterality Date  . VAGINAL DELIVERY      Prior to Admission medications   Medication Sig Start Date End Date Taking? Authorizing Provider  cyclobenzaprine (FLEXERIL) 5 MG tablet Take 1 tablet (5 mg total) by mouth 3 (three) times daily as needed for muscle spasms. 06/04/18   Charod Slawinski, Charlesetta Ivory, PA-C  docusate sodium (COLACE) 100 MG capsule Take 1 tablet once or twice daily as needed for constipation 04/30/18   Loleta Rose, MD  doxepin (SINEQUAN) 10 MG capsule Take 1 capsule (10 mg total) by mouth at bedtime as needed (itching). 04/30/18 05/30/18  Loleta Rose, MD  hydrOXYzine (ATARAX/VISTARIL) 25 MG tablet Take 1 tablet (25 mg total) by mouth 3 (three) times daily as needed for itching. 04/30/18   Loleta Rose, MD  nabumetone (RELAFEN) 750 MG tablet Take 1 tablet (750 mg total) by mouth 2 (two) times daily. 06/04/18   Mykal Kirchman, Charlesetta Ivory, PA-C  permethrin (PERMETHRIN LICE TREATMENT) 1 % lotion Apply to affected area once 04/17/18 04/16/19  Merrily Brittle, MD  predniSONE  (DELTASONE) 10 MG tablet Take 4 tabs (40 mg) PO x 3 days, then take 2 tabs (20 mg) PO x 3 days, then take 1 tab (10 mg) PO x 3 days, then take 1/2 tab (5 mg) PO x 4 days. 04/30/18   Loleta Rose, MD    Allergies Patient has no known allergies.  History reviewed. No pertinent family history.  Social History Social History   Tobacco Use  . Smoking status: Current Some Day Smoker    Types: Cigarettes  . Smokeless tobacco: Never Used  Substance Use Topics  . Alcohol use: Yes  . Drug use: Never    Review of Systems  Constitutional: Negative for fever. Eyes: Negative for visual changes. ENT: Positive for sore throat. Cardiovascular: Negative for chest pain. Respiratory: Negative for shortness of breath. Gastrointestinal: Negative for abdominal pain, vomiting and diarrhea. Genitourinary: Negative for dysuria. Musculoskeletal: Negative for back pain.  Reports left foot pain Skin: Negative for rash. Neurological: Negative for headaches, focal weakness or numbness. ____________________________________________  PHYSICAL EXAM:  VITAL SIGNS: ED Triage Vitals  Enc Vitals Group     BP 06/04/18 1719 116/75     Pulse Rate 06/04/18 1719 (!) 110     Resp 06/04/18 1719 16     Temp 06/04/18 1719 98.7 F (37.1 C)     Temp Source 06/04/18 1719 Oral     SpO2 06/04/18 1719 100 %     Weight 06/04/18 1720 114 lb (51.7 kg)     Height 06/04/18 1720 4\' 9"  (1.448  m)     Head Circumference --      Peak Flow --      Pain Score 06/04/18 1720 5     Pain Loc --      Pain Edu? --      Excl. in GC? --     Constitutional: Alert and oriented. Well appearing and in no distress. Head: Normocephalic and atraumatic. Eyes: Conjunctivae are normal. PERRL. Normal extraocular movements Ears: Canals clear. TMs intact bilaterally. Nose: No congestion/rhinorrhea/epistaxis. Mouth/Throat: Mucous membranes are moist.  Uvula is midline and tonsils are without erythema, edema, or exudate. Neck: Supple. No  thyromegaly. Hematological/Lymphatic/Immunological: No cervical lymphadenopathy. Cardiovascular: Normal rate, regular rhythm. Normal distal pulses. Respiratory: Normal respiratory effort. No wheezes/rales/rhonchi. Gastrointestinal: Soft and nontender. No distention, rebound, guarding, or rigidity Musculoskeletal: Left foot without any obvious deformity, dislocation, joint effusion to the ankle.  Normal ankle range of motion.  No calf or Achilles tenderness is appreciated.  Patient is tender to palpation to the plantar surface of the left foot at the calcaneal heel.  There is tenderness along the palpated plantar fascia. There is a small ganglion cyst noted to the radial aspect of the left volar wrist. Nontender with normal range of motion in all extremities.  Neurologic:  Normal gait without ataxia. Normal speech and language. No gross focal neurologic deficits are appreciated. Skin:  Skin is warm, dry and intact. No rash noted. Psychiatric: Mood and affect are normal. Patient exhibits appropriate insight and judgment. ____________________________________________   LABS (pertinent positives/negatives)  Labs Reviewed  GROUP A STREP BY PCR  ____________________________________________  PROCEDURES  Procedures ____________________________________________  INITIAL IMPRESSION / ASSESSMENT AND PLAN / ED COURSE  Patient with ED evaluation of sore throat complaint x1 week as well as left foot pain for several months.  Patient's exam is negative for any acute infectious process as her strep PCR is negative.  Her exam consistent with her test.  The left foot pain is likely consistent with plantar fasciitis.  Patient is discharged with a prescription for Relafen as well as Flexeril dose as directed.  She is referred to podiatry for further evaluation and management.  She should also set up with her primary provider in a local clinic for routine medical  care. ____________________________________________  FINAL CLINICAL IMPRESSION(S) / ED DIAGNOSES  Final diagnoses:  Sore throat  Foot pain, left      Danzell Birky, Charlesetta Ivory, PA-C 06/04/18 2041    Dionne Bucy, MD 06/04/18 2219

## 2018-06-04 NOTE — ED Notes (Signed)
See triage note  Developed sore throat yesterday  But has had joint pain for several months  No fever  Has tried OTC meds w/o relief of joint pain

## 2019-03-30 ENCOUNTER — Other Ambulatory Visit: Payer: Self-pay

## 2019-03-30 ENCOUNTER — Emergency Department
Admission: EM | Admit: 2019-03-30 | Discharge: 2019-03-30 | Disposition: A | Discharge status: Home / Self Care | Payer: BC Managed Care – PPO | Attending: Student in an Organized Health Care Education/Training Program | Admitting: Student in an Organized Health Care Education/Training Program

## 2019-03-30 ENCOUNTER — Emergency Department: Payer: BC Managed Care – PPO

## 2019-03-30 DIAGNOSIS — F17210 Nicotine dependence, cigarettes, uncomplicated: Secondary | ICD-10-CM | POA: Diagnosis not present

## 2019-03-30 DIAGNOSIS — R1011 Right upper quadrant pain: Secondary | ICD-10-CM | POA: Diagnosis present

## 2019-03-30 LAB — COMPREHENSIVE METABOLIC PANEL
ALT: 13 U/L (ref 0–44)
AST: 16 U/L (ref 15–41)
Albumin: 3.8 g/dL (ref 3.5–5.0)
Alkaline Phosphatase: 95 U/L (ref 38–126)
Anion gap: 8 (ref 5–15)
BUN: 8 mg/dL (ref 6–20)
CO2: 25 mmol/L (ref 22–32)
Calcium: 9.1 mg/dL (ref 8.9–10.3)
Chloride: 107 mmol/L (ref 98–111)
Creatinine, Ser: 0.87 mg/dL (ref 0.44–1.00)
GFR calc Af Amer: >60 mL/min (ref >60)
GFR calc non Af Amer: >60 mL/min (ref >60)
Glucose, Bld: 94 mg/dL (ref 70–99)
Potassium: 3.9 mmol/L (ref 3.5–5.1)
Sodium: 140 mmol/L (ref 135–145)
Total Bilirubin: 0.4 mg/dL (ref 0.3–1.2)
Total Protein: 7.1 g/dL (ref 6.5–8.1)

## 2019-03-30 LAB — URINALYSIS, COMPLETE (UACMP) WITH MICROSCOPIC
Bacteria, UA: NONE SEEN
Bilirubin Urine: NEGATIVE
Glucose, UA: NEGATIVE mg/dL
Hgb urine dipstick: NEGATIVE
Ketones, ur: NEGATIVE mg/dL
Leukocytes,Ua: NEGATIVE
Nitrite: NEGATIVE
Protein, ur: NEGATIVE mg/dL
Specific Gravity, Urine: 1.014 (ref 1.005–1.030)
pH: 6 (ref 5.0–8.0)

## 2019-03-30 LAB — CBC
HCT: 40.3 % (ref 36.0–46.0)
Hemoglobin: 13.4 g/dL (ref 12.0–15.0)
MCH: 29.3 pg (ref 26.0–34.0)
MCHC: 33.3 g/dL (ref 30.0–36.0)
MCV: 88.2 fL (ref 80.0–100.0)
Platelets: 238 10*3/uL (ref 150–400)
RBC: 4.57 MIL/uL (ref 3.87–5.11)
RDW: 13 % (ref 11.5–15.5)
WBC: 9 10*3/uL (ref 4.0–10.5)
nRBC: 0 % (ref 0.0–0.2)

## 2019-03-30 LAB — POCT PREGNANCY, URINE: Preg Test, Ur: NEGATIVE

## 2019-03-30 LAB — LIPASE, BLOOD: Lipase: 19 U/L (ref 11–51)

## 2019-03-30 MED ORDER — HYOSCYAMINE SULFATE 0.125 MG PO TABS
0.2500 mg | ORAL_TABLET | Freq: Once | ORAL | Status: AC
  Administered 2019-03-30: 0.25 mg via ORAL
  Filled 2019-03-30: qty 2

## 2019-03-30 MED ORDER — IOHEXOL 300 MG/ML  SOLN
100.0000 mL | Freq: Once | INTRAMUSCULAR | Status: AC | PRN
  Administered 2019-03-30: 19:00:00 100 mL via INTRAVENOUS
  Filled 2019-03-30: qty 100

## 2019-03-30 MED ORDER — HYOSCYAMINE SULFATE 0.125 MG PO TBDP: 0.1250 mg | ORAL_TABLET | Freq: Four times a day (QID) | ORAL | 0 refills | Status: DC | PRN

## 2019-03-30 MED ORDER — TRAMADOL HCL 50 MG PO TABS
50.0000 mg | ORAL_TABLET | Freq: Once | ORAL | Status: AC
  Administered 2019-03-30: 17:00:00 50 mg via ORAL
  Filled 2019-03-30: qty 1

## 2019-03-30 MED ORDER — KETOROLAC TROMETHAMINE 30 MG/ML IJ SOLN
15.0000 mg | Freq: Once | INTRAMUSCULAR | Status: AC
  Administered 2019-03-30: 15 mg via INTRAVENOUS
  Filled 2019-03-30: qty 1

## 2019-03-30 MED ORDER — KETOROLAC TROMETHAMINE 30 MG/ML IJ SOLN
15.0000 mg | Freq: Once | INTRAMUSCULAR | Status: DC
  Filled 2019-03-30: qty 1

## 2019-03-30 NOTE — Discharge Instructions (Signed)
Please follow-up with the primary care provider of your choice for symptoms that are not improving over the next few days.  If your symptoms change or worsen, return to the emergency department.  Take your medication as prescribed.

## 2019-03-30 NOTE — ED Notes (Signed)
Pt verbalized understanding of discharge instructions. NAD at this time. 

## 2019-03-30 NOTE — ED Provider Notes (Signed)
[facility] Emergency Department Provider Note ____________________________________________   First MD Initiated Contact with Patient 03/30/19 1553     (approximate)  I have reviewed the triage vital signs and the nursing notes.   HISTORY  Chief Complaint Abdominal Pain  HPI Casey Underwood is a 23 y.o. female who presents to the emergency department for treatment and evaluation of right upper quadrant pain that started this morning. No vomiting. Single episode of diarrhea last night. No alleviating measures prior to arrival.  Past Medical History:  Diagnosis Date   Herpes simplex    Pelvic inflammatory disease     There are no active problems to display for this patient.   Past Surgical History:  Procedure Laterality Date   VAGINAL DELIVERY      Prior to Admission medications   Medication Sig Start Date End Date Taking? Authorizing Provider  cyclobenzaprine (FLEXERIL) 5 MG tablet Take 1 tablet (5 mg total) by mouth 3 (three) times daily as needed for muscle spasms. 06/04/18   Menshew, Dannielle Karvonen, PA-C  docusate sodium (COLACE) 100 MG capsule Take 1 tablet once or twice daily as needed for constipation 04/30/18   Hinda Kehr, MD  doxepin (SINEQUAN) 10 MG capsule Take 1 capsule (10 mg total) by mouth at bedtime as needed (itching). 04/30/18 05/30/18  Hinda Kehr, MD  hydrOXYzine (ATARAX/VISTARIL) 25 MG tablet Take 1 tablet (25 mg total) by mouth 3 (three) times daily as needed for itching. 04/30/18   Hinda Kehr, MD  hyoscyamine (ANASPAZ) 0.125 MG TBDP disintergrating tablet Place 1 tablet (0.125 mg total) under the tongue every 6 (six) hours as needed. 03/30/19   Elowyn Raupp, Johnette Abraham B, FNP  nabumetone (RELAFEN) 750 MG tablet Take 1 tablet (750 mg total) by mouth 2 (two) times daily. 06/04/18   Menshew, Dannielle Karvonen, PA-C  permethrin (PERMETHRIN LICE TREATMENT) 1 % lotion Apply to affected area once 04/17/18 04/16/19  Darel Hong, MD  predniSONE  (DELTASONE) 10 MG tablet Take 4 tabs (40 mg) PO x 3 days, then take 2 tabs (20 mg) PO x 3 days, then take 1 tab (10 mg) PO x 3 days, then take 1/2 tab (5 mg) PO x 4 days. 04/30/18   Hinda Kehr, MD    Allergies Patient has no known allergies.  History reviewed. No pertinent family history.  Social History Social History   Tobacco Use   Smoking status: Current Some Day Smoker    Types: Cigarettes   Smokeless tobacco: Never Used  Substance Use Topics   Alcohol use: Yes   Drug use: Never    Review of Systems  Constitutional: No fever/chills Eyes: No visual changes. ENT: No sore throat. Cardiovascular: Denies chest pain. Respiratory: Denies shortness of breath. Gastrointestinal: No abdominal pain.  No nausea, no vomiting.  No diarrhea.  No constipation. Genitourinary: Negative for dysuria. Musculoskeletal: Negative for back pain. Skin: Negative for rash. Neurological: Negative for headaches, focal weakness or numbness. ____________________________________________   PHYSICAL EXAM:  VITAL SIGNS: ED Triage Vitals  Enc Vitals Group     BP 03/30/19 1433 115/81     Pulse Rate 03/30/19 1433 93     Resp 03/30/19 1433 18     Temp 03/30/19 1433 99 F (37.2 C)     Temp Source 03/30/19 1433 Oral     SpO2 03/30/19 1433 100 %     Weight 03/30/19 1433 120 lb (54.4 kg)     Height 03/30/19 1433 4\' 9"  (1.448 m)  Head Circumference --      Peak Flow --      Pain Score 03/30/19 1454 7     Pain Loc --      Pain Edu? --      Excl. in GC? --     Constitutional: Alert and oriented. Well appearing and in no acute distress. Eyes: Conjunctivae are normal. PERRL. EOMI. Head: Atraumatic. Nose: No congestion/rhinnorhea. Mouth/Throat: Mucous membranes are moist.  Oropharynx non-erythematous. Neck: No stridor.   Hematological/Lymphatic/Immunilogical: No cervical lymphadenopathy. Cardiovascular: Normal rate, regular rhythm. Grossly normal heart sounds.  Good peripheral  circulation. Respiratory: Normal respiratory effort.  No retractions. Lungs CTAB. Gastrointestinal: Soft and nontender. No distention. No abdominal bruits. No CVA tenderness. Genitourinary:  Musculoskeletal: No lower extremity tenderness nor edema.  No joint effusions. Neurologic:  Normal speech and language. No gross focal neurologic deficits are appreciated. No gait instability. Skin:  Skin is warm, dry and intact. No rash noted. Psychiatric: Mood and affect are normal. Speech and behavior are normal.  ____________________________________________   LABS (all labs ordered are listed, but only abnormal results are displayed)  Labs Reviewed  URINALYSIS, COMPLETE (UACMP) WITH MICROSCOPIC - Abnormal; Notable for the following components:      Result Value   Color, Urine YELLOW (*)    APPearance CLEAR (*)    All other components within normal limits  LIPASE, BLOOD  COMPREHENSIVE METABOLIC PANEL  CBC  POCT PREGNANCY, URINE  POC URINE PREG, ED   ____________________________________________  EKG  Not indicated ____________________________________________  RADIOLOGY  ED MD interpretation:    Ultrasound image of the right upper quadrant is negative for acute findings.  Incidental finding of a focal lesion on the dome of the liver.  CT scan of the abdomen and pelvis with IV contrast shows no findings to explain right upper quadrant abdominal pain there is again seen a an incidental finding of a benign sub-capsular hemangioma of the posterior liver dome.  Official radiology report(s): Ct Abdomen Pelvis W Contrast  Result Date: 03/30/2019 CLINICAL DATA:  Right upper quadrant pain EXAM: CT ABDOMEN AND PELVIS WITH CONTRAST TECHNIQUE: Multidetector CT imaging of the abdomen and pelvis was performed using the standard protocol following bolus administration of intravenous contrast. CONTRAST:  100mL OMNIPAQUE IOHEXOL 300 MG/ML  SOLN COMPARISON:  None. FINDINGS: Lower chest: No acute  abnormality. Hepatobiliary: Incidental benign subcapsular hemangioma of the posterior liver dome (series 2, image 9). No gallstones, gallbladder wall thickening, or biliary dilatation. Pancreas: Unremarkable. No pancreatic ductal dilatation or surrounding inflammatory changes. Spleen: Normal in size without significant abnormality. Adrenals/Urinary Tract: Adrenal glands are unremarkable. Kidneys are normal, without renal calculi, solid lesion, or hydronephrosis. Bladder is unremarkable. Stomach/Bowel: Stomach is within normal limits. Appendix appears normal. No evidence of bowel wall thickening, distention, or inflammatory changes. Vascular/Lymphatic: Incidental note of a retroaortic left renal vein. No enlarged abdominal or pelvic lymph nodes. Reproductive: No mass or other significant abnormality. Other: No abdominal wall hernia or abnormality. Small volume free fluid in the dependent low pelvis. Musculoskeletal: No acute or significant osseous findings. IMPRESSION: 1. No CT findings of the abdomen or pelvis to explain right upper quadrant abdominal pain. 2. Small volume free fluid in the dependent low pelvis, likely functional in the reproductive age setting. Electronically Signed   By: Lauralyn PrimesAlex  Bibbey M.D.   On: 03/30/2019 19:03   [place] Abdomen Limited Ruq  Result Date: 03/30/2019 CLINICAL DATA:  Right upper quadrant pain EXAM: ULTRASOUND ABDOMEN LIMITED RIGHT UPPER QUADRANT COMPARISON:  None. FINDINGS:  Gallbladder: No gallstones or gallbladder wall thickening. No pericholecystic fluid. The sonographer reports no sonographic Murphy's sign. Common bile duct: Diameter: 3 mm Liver: 2.2 cm hyperechoic lesion identified in the subcapsular hepatic dome. Portal vein is patent on color Doppler imaging with normal direction of blood flow towards the liver. Other: None. IMPRESSION: 1. No acute findings. 2. 2.2 cm hyperechoic lesion in the dome of the liver. Given patient age, this is likely benign. Hemangioma would be a  distinct consideration. MRI without with contrast could be used to further evaluate as clinically warranted. Electronically Signed   By: Kennith CenterEric  Mansell M.D.   On: 03/30/2019 17:27    ____________________________________________   PROCEDURES  Procedure(s) performed (including Critical Care):  Procedures  ____________________________________________   INITIAL IMPRESSION / ASSESSMENT AND PLAN     23 year old female presenting to the emergency department for treatment and evaluation of right upper quadrant abdominal pain.  While waiting room assignment, protocol labs were drawn.  Does not look like she has any infectious process.  Her white blood cell count is 9 with no indication of a shift.  Her urinalysis does not show any concern of acute cystitis.  Her lipase is normal.  Her CMP is normal as well.  Initial plan will be to ultrasound the right upper quadrant for evaluation of clothing cholecystitis, cholelithiasis.  On exam, she is not exquisitely tender.  DIFFERENTIAL DIAGNOSIS  Differential diagnosis includes, but is not limited to, biliary disease (biliary colic, acute cholecystitis, cholangitis, choledocholithiasis, etc), intrathoracic causes for epigastric abdominal pain including ACS, gastritis, duodenitis, pancreatitis, small bowel or large bowel obstruction, abdominal aortic aneurysm, hernia, and ulcer(s).   ED COURSE  Ultrasound of the right upper quadrant does not show any evidence of acute cholecystitis or cholelithiasis.  Patient continues to complain of the pain.  Levsin has been ordered.  ----------------------------------------- 6:25 PM on 03/30/2019 -----------------------------------------  Patient states that the pain in her RUQ is still there despite Tramadol and Levsin. I had ordered Toradol, but she refused an injection. Each time I go in the room she is lying with her eyes closed in a relaxed position. She awakens to voice. She states that she is the same as she  was when she got here. Will do CT of the abdomen and pelvis if she will allow the IV for contrast.  ----------------------------------------- 7:28 PM on 03/30/2019 -----------------------------------------  CT scan of the abdomen and pelvis is normal.  Patient states that the pain is now resolved.  She will be given a prescription for the Levsin.  She states that she thinks that it helped.  She will also be given a referral to gastroenterology.  She was encouraged to follow-up with primary care or gastroenterology for symptoms of concern.  She was advised to return to the emergency department if she gets worse. ____________________________________________   FINAL CLINICAL IMPRESSION(S) / ED DIAGNOSES  Final diagnoses:  Right upper quadrant abdominal pain     ED Discharge Orders         Ordered    hyoscyamine (ANASPAZ) 0.125 MG TBDP disintergrating tablet  Every 6 hours PRN     03/30/19 1818           Note:  This document was prepared using Dragon voice recognition software and may include unintentional dictation errors.   Chinita Pesterriplett, Sade Hollon B, FNP 03/30/19 Serena [id number]    Willy Eddyobinson, Patrick, MD 03/30/19 2038

## 2019-03-30 NOTE — ED Notes (Signed)
FIRST NURSE NOTE:  Pt arrived via POV with reports abdominal pain, pt c/o RUQ abdominal pain x 1 week, per EMS the patient reports the pain is non-radiating and is tender to touch. BP 120/72 p-105, hx IBS and constipation.

## 2019-03-30 NOTE — ED Triage Notes (Signed)
Pt arrives to ed via ems form home with c/o right upper abd pain. Pain increases with with palpation, pt reports 1 episode of diarrhea last night/early this morning. NAD noted at this time

## 2019-03-30 NOTE — ED Notes (Signed)
Blood drawn.

## 2019-04-27 ENCOUNTER — Observation Stay
Admission: EM | Admit: 2019-04-27 | Discharge: 2019-04-29 | Disposition: A | Discharge status: Home / Self Care | Payer: BC Managed Care – PPO | Attending: Internal Medicine | Admitting: Internal Medicine

## 2019-04-27 ENCOUNTER — Encounter: Payer: Self-pay | Admitting: Emergency Medicine

## 2019-04-27 ENCOUNTER — Emergency Department: Payer: BC Managed Care – PPO

## 2019-04-27 ENCOUNTER — Other Ambulatory Visit: Payer: Self-pay

## 2019-04-27 DIAGNOSIS — Z20828 Contact with and (suspected) exposure to other viral communicable diseases: Secondary | ICD-10-CM | POA: Diagnosis not present

## 2019-04-27 DIAGNOSIS — T71191A Asphyxiation due to mechanical threat to breathing due to other causes, accidental, initial encounter: Secondary | ICD-10-CM | POA: Insufficient documentation

## 2019-04-27 DIAGNOSIS — T07XXXA Unspecified multiple injuries, initial encounter: Secondary | ICD-10-CM | POA: Insufficient documentation

## 2019-04-27 DIAGNOSIS — M549 Dorsalgia, unspecified: Secondary | ICD-10-CM | POA: Insufficient documentation

## 2019-04-27 DIAGNOSIS — N760 Acute vaginitis: Secondary | ICD-10-CM | POA: Diagnosis present

## 2019-04-27 DIAGNOSIS — Z0184 Encounter for antibody response examination: Secondary | ICD-10-CM | POA: Diagnosis not present

## 2019-04-27 DIAGNOSIS — S0990XA Unspecified injury of head, initial encounter: Secondary | ICD-10-CM | POA: Insufficient documentation

## 2019-04-27 DIAGNOSIS — R51 Headache: Secondary | ICD-10-CM | POA: Diagnosis not present

## 2019-04-27 DIAGNOSIS — N10 Acute tubulo-interstitial nephritis: Secondary | ICD-10-CM | POA: Diagnosis not present

## 2019-04-27 DIAGNOSIS — T7621XA Adult sexual abuse, suspected, initial encounter: Secondary | ICD-10-CM | POA: Diagnosis not present

## 2019-04-27 DIAGNOSIS — Z791 Long term (current) use of non-steroidal anti-inflammatories (NSAID): Secondary | ICD-10-CM | POA: Insufficient documentation

## 2019-04-27 DIAGNOSIS — R1030 Lower abdominal pain, unspecified: Secondary | ICD-10-CM | POA: Insufficient documentation

## 2019-04-27 DIAGNOSIS — Z79899 Other long term (current) drug therapy: Secondary | ICD-10-CM | POA: Insufficient documentation

## 2019-04-27 DIAGNOSIS — B9689 Other specified bacterial agents as the cause of diseases classified elsewhere: Secondary | ICD-10-CM | POA: Diagnosis present

## 2019-04-27 DIAGNOSIS — N12 Tubulo-interstitial nephritis, not specified as acute or chronic: Secondary | ICD-10-CM | POA: Diagnosis present

## 2019-04-27 DIAGNOSIS — F17210 Nicotine dependence, cigarettes, uncomplicated: Secondary | ICD-10-CM | POA: Insufficient documentation

## 2019-04-27 LAB — WET PREP, GENITAL
Sperm: NONE SEEN
Trich, Wet Prep: NONE SEEN
Yeast Wet Prep HPF POC: NONE SEEN

## 2019-04-27 LAB — COMPREHENSIVE METABOLIC PANEL
ALT: 9 U/L (ref 0–44)
AST: 14 U/L — ABNORMAL LOW (ref 15–41)
Albumin: 3.7 g/dL (ref 3.5–5.0)
Alkaline Phosphatase: 88 U/L (ref 38–126)
Anion gap: 7 (ref 5–15)
BUN: 12 mg/dL (ref 6–20)
CO2: 23 mmol/L (ref 22–32)
Calcium: 8.7 mg/dL — ABNORMAL LOW (ref 8.9–10.3)
Chloride: 109 mmol/L (ref 98–111)
Creatinine, Ser: 1.12 mg/dL — ABNORMAL HIGH (ref 0.44–1.00)
GFR calc Af Amer: >60 mL/min (ref >60)
GFR calc non Af Amer: >60 mL/min (ref >60)
Glucose, Bld: 83 mg/dL (ref 70–99)
Potassium: 3.6 mmol/L (ref 3.5–5.1)
Sodium: 139 mmol/L (ref 135–145)
Total Bilirubin: 0.4 mg/dL (ref 0.3–1.2)
Total Protein: 7 g/dL (ref 6.5–8.1)

## 2019-04-27 LAB — CBC WITH DIFFERENTIAL/PLATELET
Abs Immature Granulocytes: 0.04 10*3/uL (ref 0.00–0.07)
Basophils Absolute: 0 10*3/uL (ref 0.0–0.1)
Basophils Relative: 0 %
Eosinophils Absolute: 0.5 10*3/uL (ref 0.0–0.5)
Eosinophils Relative: 6 %
HCT: 39.4 % (ref 36.0–46.0)
Hemoglobin: 12.9 g/dL (ref 12.0–15.0)
Immature Granulocytes: 0 %
Lymphocytes Relative: 39 %
Lymphs Abs: 3.6 10*3/uL (ref 0.7–4.0)
MCH: 29.2 pg (ref 26.0–34.0)
MCHC: 32.7 g/dL (ref 30.0–36.0)
MCV: 89.1 fL (ref 80.0–100.0)
Monocytes Absolute: 0.6 10*3/uL (ref 0.1–1.0)
Monocytes Relative: 7 %
Neutro Abs: 4.4 10*3/uL (ref 1.7–7.7)
Neutrophils Relative %: 48 %
Platelets: 205 10*3/uL (ref 150–400)
RBC: 4.42 MIL/uL (ref 3.87–5.11)
RDW: 13.8 % (ref 11.5–15.5)
WBC: 9.2 10*3/uL (ref 4.0–10.5)
nRBC: 0 % (ref 0.0–0.2)

## 2019-04-27 LAB — URINALYSIS, COMPLETE (UACMP) WITH MICROSCOPIC
Bacteria, UA: NONE SEEN
Bilirubin Urine: NEGATIVE
Glucose, UA: NEGATIVE mg/dL
Ketones, ur: NEGATIVE mg/dL
Nitrite: NEGATIVE
Protein, ur: NEGATIVE mg/dL
Specific Gravity, Urine: 1.017 (ref 1.005–1.030)
pH: 5 (ref 5.0–8.0)

## 2019-04-27 LAB — PREGNANCY, URINE: Preg Test, Ur: NEGATIVE

## 2019-04-27 MED ORDER — AZITHROMYCIN 500 MG PO TABS
1000.0000 mg | ORAL_TABLET | Freq: Once | ORAL | Status: AC
  Administered 2019-04-27: 1000 mg via ORAL
  Filled 2019-04-27: qty 2

## 2019-04-27 MED ORDER — METRONIDAZOLE 500 MG PO TABS: 500.0000 mg | ORAL_TABLET | Freq: Three times a day (TID) | ORAL | 0 refills | Status: DC

## 2019-04-27 MED ORDER — CEFTRIAXONE SODIUM 250 MG IJ SOLR: 250.0000 mg | Freq: Once | INTRAMUSCULAR | Status: DC

## 2019-04-27 MED ORDER — IOHEXOL 300 MG/ML  SOLN
120.0000 mL | Freq: Once | INTRAMUSCULAR | Status: AC | PRN
  Administered 2019-04-27: 120 mL via INTRAVENOUS
  Filled 2019-04-27: qty 120

## 2019-04-27 MED ORDER — DEXTROSE 5 % IV SOLN
250.0000 mg | Freq: Once | INTRAVENOUS | Status: AC
  Administered 2019-04-27: 250 mg via INTRAVENOUS
  Filled 2019-04-27: qty 250
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
## 2019-04-27 NOTE — ED Notes (Signed)
SANE Casey Underwood speaking with pt on telephone.
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
## 2019-04-27 NOTE — ED Notes (Signed)
Patient transported to CT 
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
## 2019-04-28 ENCOUNTER — Other Ambulatory Visit: Payer: Self-pay

## 2019-04-28 DIAGNOSIS — B9689 Other specified bacterial agents as the cause of diseases classified elsewhere: Secondary | ICD-10-CM | POA: Diagnosis present

## 2019-04-28 DIAGNOSIS — N12 Tubulo-interstitial nephritis, not specified as acute or chronic: Secondary | ICD-10-CM | POA: Diagnosis present

## 2019-04-28 LAB — CBC
HCT: 38.7 % (ref 36.0–46.0)
Hemoglobin: 12.4 g/dL (ref 12.0–15.0)
MCH: 28.6 pg (ref 26.0–34.0)
MCHC: 32 g/dL (ref 30.0–36.0)
MCV: 89.4 fL (ref 80.0–100.0)
Platelets: 195 10*3/uL (ref 150–400)
RBC: 4.33 MIL/uL (ref 3.87–5.11)
RDW: 13.8 % (ref 11.5–15.5)
WBC: 9.9 10*3/uL (ref 4.0–10.5)
nRBC: 0 % (ref 0.0–0.2)

## 2019-04-28 LAB — BASIC METABOLIC PANEL
Anion gap: 6 (ref 5–15)
BUN: 12 mg/dL (ref 6–20)
CO2: 22 mmol/L (ref 22–32)
Calcium: 8.5 mg/dL — ABNORMAL LOW (ref 8.9–10.3)
Chloride: 111 mmol/L (ref 98–111)
Creatinine, Ser: 1.24 mg/dL — ABNORMAL HIGH (ref 0.44–1.00)
GFR calc Af Amer: >60 mL/min (ref >60)
GFR calc non Af Amer: >60 mL/min (ref >60)
Glucose, Bld: 92 mg/dL (ref 70–99)
Potassium: 4.2 mmol/L (ref 3.5–5.1)
Sodium: 139 mmol/L (ref 135–145)

## 2019-04-28 LAB — CBC WITH DIFFERENTIAL/PLATELET
Abs Immature Granulocytes: 0.04 10*3/uL (ref 0.00–0.07)
Basophils Absolute: 0 10*3/uL (ref 0.0–0.1)
Basophils Relative: 0 %
Eosinophils Absolute: 0.6 10*3/uL — ABNORMAL HIGH (ref 0.0–0.5)
Eosinophils Relative: 6 %
HCT: 39.7 % (ref 36.0–46.0)
Hemoglobin: 13 g/dL (ref 12.0–15.0)
Immature Granulocytes: 0 %
Lymphocytes Relative: 35 %
Lymphs Abs: 3.3 10*3/uL (ref 0.7–4.0)
MCH: 28.9 pg (ref 26.0–34.0)
MCHC: 32.7 g/dL (ref 30.0–36.0)
MCV: 88.2 fL (ref 80.0–100.0)
Monocytes Absolute: 0.6 10*3/uL (ref 0.1–1.0)
Monocytes Relative: 6 %
Neutro Abs: 5 10*3/uL (ref 1.7–7.7)
Neutrophils Relative %: 53 %
Platelets: 206 10*3/uL (ref 150–400)
RBC: 4.5 MIL/uL (ref 3.87–5.11)
RDW: 13.7 % (ref 11.5–15.5)
WBC: 9.5 10*3/uL (ref 4.0–10.5)
nRBC: 0 % (ref 0.0–0.2)

## 2019-04-28 LAB — RPR: RPR Ser Ql: NONREACTIVE

## 2019-04-28 LAB — SARS CORONAVIRUS 2 (TAT 6-24 HRS): SARS Coronavirus 2: NEGATIVE

## 2019-04-28 MED ORDER — ONDANSETRON HCL 4 MG PO TABS: 4.0000 mg | ORAL_TABLET | Freq: Four times a day (QID) | ORAL | Status: DC | PRN

## 2019-04-28 MED ORDER — OXYCODONE-ACETAMINOPHEN 5-325 MG PO TABS
1.0000 | ORAL_TABLET | Freq: Four times a day (QID) | ORAL | Status: DC | PRN
  Administered 2019-04-28: 14:00:00 2 via ORAL
  Filled 2019-04-28: qty 2

## 2019-04-28 MED ORDER — SODIUM CHLORIDE 0.9 % IV SOLN
INTRAVENOUS | Status: DC | PRN
  Administered 2019-04-28: 23:00:00 500 mL via INTRAVENOUS

## 2019-04-28 MED ORDER — ONDANSETRON HCL 4 MG/2ML IJ SOLN: 4.0000 mg | Freq: Four times a day (QID) | INTRAMUSCULAR | Status: DC | PRN

## 2019-04-28 MED ORDER — ACETAMINOPHEN 325 MG PO TABS: 650.0000 mg | ORAL_TABLET | Freq: Four times a day (QID) | ORAL | Status: DC | PRN

## 2019-04-28 MED ORDER — ACETAMINOPHEN 650 MG RE SUPP: 650.0000 mg | Freq: Four times a day (QID) | RECTAL | Status: DC | PRN

## 2019-04-28 MED ORDER — IBUPROFEN 400 MG PO TABS
600.0000 mg | ORAL_TABLET | Freq: Four times a day (QID) | ORAL | Status: DC | PRN
  Administered 2019-04-28: 02:00:00 600 mg via ORAL
  Filled 2019-04-28: qty 2

## 2019-04-28 MED ORDER — DEXTROSE 5 % IV SOLN
250.0000 mg | Freq: Once | INTRAVENOUS | Status: AC
  Administered 2019-04-28: 250 mg via INTRAVENOUS
  Filled 2019-04-28: qty 250

## 2019-04-28 MED ORDER — KETOROLAC TROMETHAMINE 30 MG/ML IJ SOLN: 30.0000 mg | Freq: Four times a day (QID) | INTRAMUSCULAR | Status: DC | PRN

## 2019-04-28 MED ORDER — METRONIDAZOLE 500 MG PO TABS
500.0000 mg | ORAL_TABLET | Freq: Three times a day (TID) | ORAL | Status: DC
  Administered 2019-04-28 – 2019-04-29 (×4): 500 mg via ORAL
  Filled 2019-04-28 (×6): qty 1
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
## 2019-04-28 NOTE — Plan of Care (Signed)
  Problem: Education: Goal: Knowledge of General Education information will improve Description Including pain rating scale, medication(s)/side effects and non-pharmacologic comfort measures Outcome: Progressing   
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
## 2019-04-28 NOTE — Plan of Care (Signed)
  Problem: Nutrition: Goal: Adequate nutrition will be maintained Outcome: Progressing  Patient ate 85 percent of her dinner tray.

## 2019-04-28 NOTE — ED Notes (Signed)
Report to paige, rn.  
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
## 2019-04-29 LAB — URINE CULTURE: Culture: NO GROWTH

## 2019-04-29 MED ORDER — METRONIDAZOLE 500 MG PO TABS: 500.0000 mg | ORAL_TABLET | Freq: Three times a day (TID) | ORAL | 0 refills | Status: AC

## 2019-04-29 MED ORDER — CIPROFLOXACIN HCL 500 MG PO TABS: 500.0000 mg | ORAL_TABLET | Freq: Two times a day (BID) | ORAL | 0 refills | Status: AC

## 2019-04-29 MED ORDER — OXYCODONE-ACETAMINOPHEN 5-325 MG PO TABS: 1.0000 | ORAL_TABLET | Freq: Three times a day (TID) | ORAL | 0 refills | Status: DC | PRN

## 2019-04-29 MED ORDER — CIPROFLOXACIN HCL 500 MG PO TABS: 500.0000 mg | ORAL_TABLET | Freq: Two times a day (BID) | ORAL | 0 refills | Status: DC

## 2019-04-29 MED ORDER — METRONIDAZOLE 500 MG PO TABS: 500.0000 mg | ORAL_TABLET | Freq: Three times a day (TID) | ORAL | 0 refills | Status: DC
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
## 2019-04-29 NOTE — Progress Notes (Signed)
Casey Underwood  A and O x 4 VSS. Pt tolerating diet well. No complaints of pain or nausea. IV removed intact, prescriptions given. Pt voices understanding of discharge instructions with no further questions. Pt discharged via wheelchair with axillary.   Allergies as of 04/29/2019   No Known Allergies     Medication List    TAKE these medications   ciprofloxacin 500 MG tablet Commonly known as: Cipro Take 1 tablet (500 mg total) by mouth 2 (two) times daily for 6 days.   metroNIDAZOLE 500 MG tablet Commonly known as: Flagyl Take 1 tablet (500 mg total) by mouth 3 (three) times daily for 6 days.   oxyCODONE-acetaminophen 5-325 MG tablet Commonly known as: PERCOCET/ROXICET Take 1 tablet by mouth every 8 (eight) hours as needed for moderate pain.       Vitals:   04/29/19 0550 04/29/19 1256  BP: 117/81 (!) 141/71  Pulse: (!) 56 63  Resp: 20 20  Temp: 98.6 F (37 C) 98.4 F (36.9 C)  SpO2: 100% 100%    Casey Underwood
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
## 2019-04-30 LAB — HIV ANTIBODY (ROUTINE TESTING W REFLEX): HIV Screen 4th Generation wRfx: NONREACTIVE

## 2019-05-06 LAB — GC/CHLAMYDIA PROBE AMP
Chlamydia trachomatis, NAA: POSITIVE — AB
Neisseria Gonorrhoeae by PCR: NEGATIVE

## 2019-07-12 ENCOUNTER — Encounter: Payer: Self-pay | Admitting: Emergency Medicine

## 2019-07-12 ENCOUNTER — Other Ambulatory Visit: Payer: Self-pay

## 2019-07-12 ENCOUNTER — Emergency Department: Payer: Self-pay

## 2019-07-12 ENCOUNTER — Emergency Department
Admission: EM | Admit: 2019-07-12 | Discharge: 2019-07-12 | Disposition: A | Discharge status: Home / Self Care | Payer: Self-pay | Attending: Emergency Medicine | Admitting: Emergency Medicine

## 2019-07-12 DIAGNOSIS — Y939 Activity, unspecified: Secondary | ICD-10-CM | POA: Insufficient documentation

## 2019-07-12 DIAGNOSIS — Y999 Unspecified external cause status: Secondary | ICD-10-CM | POA: Insufficient documentation

## 2019-07-12 DIAGNOSIS — W2209XA Striking against other stationary object, initial encounter: Secondary | ICD-10-CM | POA: Insufficient documentation

## 2019-07-12 DIAGNOSIS — S60221A Contusion of right hand, initial encounter: Secondary | ICD-10-CM | POA: Insufficient documentation

## 2019-07-12 DIAGNOSIS — Y929 Unspecified place or not applicable: Secondary | ICD-10-CM | POA: Insufficient documentation

## 2019-07-12 DIAGNOSIS — F17210 Nicotine dependence, cigarettes, uncomplicated: Secondary | ICD-10-CM | POA: Insufficient documentation

## 2019-07-12 MED ORDER — TRAMADOL HCL 50 MG PO TABS: 50.0000 mg | ORAL_TABLET | Freq: Four times a day (QID) | ORAL | 0 refills | Status: DC | PRN

## 2019-07-12 MED ORDER — NAPROXEN 500 MG PO TABS: 500.0000 mg | ORAL_TABLET | Freq: Two times a day (BID) | ORAL | 0 refills | Status: DC

## 2019-07-12 NOTE — ED Provider Notes (Signed)
[facility] Emergency Department Provider Note   ____________________________________________   First MD Initiated Contact with Patient 07/12/19 1234     (approximate)  I have reviewed the triage vital signs and the nursing notes.   HISTORY  Chief Complaint Hand Pain   HPI Casey Underwood is a 23 y.o. female presents to the ED with complaint of right hand pain.  Patient states she hit the door facing 2 weeks ago and has continued to have pain in her right hand especially the fifth metacarpal since that time.  She has taken an occasional Aleve without any relief of her pain.      Past Medical History:  Diagnosis Date  . Herpes simplex   . Pelvic inflammatory disease     Patient Active Problem List   Diagnosis Date Noted  . BV (bacterial vaginosis) 04/28/2019  . Pyelonephritis 04/28/2019    Past Surgical History:  Procedure Laterality Date  . VAGINAL DELIVERY      Prior to Admission medications   Medication Sig Start Date End Date Taking? Authorizing Provider  naproxen (NAPROSYN) 500 MG tablet Take 1 tablet (500 mg total) by mouth 2 (two) times daily with a meal. 07/12/19   Johnn Hai, PA-C  traMADol (ULTRAM) 50 MG tablet Take 1 tablet (50 mg total) by mouth every 6 (six) hours as needed. 07/12/19   Johnn Hai, PA-C    Allergies Patient has no known allergies.  No family history on file.  Social History Social History   Tobacco Use  . Smoking status: Current Some Day Smoker    Types: Cigarettes  . Smokeless tobacco: Never Used  Substance Use Topics  . Alcohol use: Yes  . Drug use: Never    Review of Systems Constitutional: No fever/chills Cardiovascular: Denies chest pain. Respiratory: Denies shortness of breath. Musculoskeletal: Positive right hand pain. Skin: Negative for rash. Neurological: Negative for headaches, focal weakness or numbness. ___________________________________________   PHYSICAL EXAM:   VITAL SIGNS: ED Triage Vitals  Enc Vitals Group     BP      Pulse      Resp      Temp      Temp src      SpO2      Weight      Height      Head Circumference      Peak Flow      Pain Score      Pain Loc      Pain Edu?      Excl. in [place]?     Constitutional: Alert and oriented. Well appearing and in no acute distress. Eyes: Conjunctivae are normal.  Head: Atraumatic. Neck: No stridor.   Cardiovascular: Normal rate, regular rhythm. Grossly normal heart sounds.  Good peripheral circulation. Respiratory: Normal respiratory effort.  No retractions. Lungs CTAB. Musculoskeletal: On examination of the right hand there is some tenderness noted on the distal fifth metacarpal.  Minimal soft tissue edema is present.  Patient is able move all digits without any difficulty.  Skin is intact.  Capillary refill is less than 3 seconds. Neurologic:  Normal speech and language. No gross focal neurologic deficits are appreciated. Skin:  Skin is warm, dry and intact.  No ecchymosis or abrasions were seen. Psychiatric: Mood and affect are normal. Speech and behavior are normal.  ____________________________________________   LABS (all labs ordered are listed, but only abnormal results are displayed)  Labs Reviewed - No data to display  RADIOLOGY  Official radiology report(s): Dg Hand Complete Right  Result Date: 07/12/2019 CLINICAL DATA:  Right hand pain, hit door 2 weeks ago EXAM: RIGHT HAND - COMPLETE 3+ VIEW COMPARISON:  None. FINDINGS: Anatomic alignment is maintained. There is no acute fracture. Joint spaces are preserved. There is no intrinsic osseous lesion. IMPRESSION: No acute fracture or malalignment. Electronically Signed   By: Guadlupe Spanish M.D.   On: 07/12/2019 13:02    ____________________________________________   PROCEDURES  Procedure(s) performed (including Critical Care):  Procedures  Ace wrap applied by RN. ____________________________________________   INITIAL  IMPRESSION / ASSESSMENT AND PLAN / ED COURSE  As part of my medical decision making, I reviewed the following data within the electronic MEDICAL RECORD NUMBER Notes from prior ED visits and [place] Controlled Substance Database  23 year old female presents to the ED with complaint of right hand pain after she hit the door facing 2 weeks ago.  She states that she has been taking over-the-counter Aleve infrequently with out relief of her hand pain.  Exam was benign with the exception of some tenderness on palpation of the fifth metacarpal.  X-rays were negative and patient was reassured.  She was given a prescription for naproxen 500 mg twice daily with food and tramadol every 6 hours as needed for severe pain.  She is encouraged to ice and elevate.  If she continues to have problems she needs to make an appointment with Dr. Deeann Saint for reevaluation as needed.  ____________________________________________   FINAL CLINICAL IMPRESSION(S) / ED DIAGNOSES  Final diagnoses:  Contusion of right hand, initial encounter     ED Discharge Orders         Ordered    traMADol (ULTRAM) 50 MG tablet  Every 6 hours PRN     07/12/19 1325    naproxen (NAPROSYN) 500 MG tablet  2 times daily with meals     07/12/19 1325           Note:  This document was prepared using Dragon voice recognition software and may include unintentional dictation errors.    Tommi Rumps, PA-C 07/12/19 1330    Concha Se, MD 07/13/19 [id number]

## 2019-07-12 NOTE — ED Triage Notes (Signed)
States she hit her right hand on door facing 2 weeks ago  Pain and swelling mainly noted to right 5 th finger

## 2019-07-12 NOTE — Discharge Instructions (Addendum)
Call make an appoint with Dr. Sabra Heck if any continued problems with your hand.  You may also follow-up with your primary care provider if any continued medication is needed.  Both medications were sent to pharmacy.  The tramadol cannot be taken while driving or operating machinery as it may cause drowsiness and increase your risk for injury.  Ice and elevation.  Wear Ace wrap for protection.

## 2019-08-14 ENCOUNTER — Other Ambulatory Visit: Payer: Self-pay

## 2019-08-14 ENCOUNTER — Emergency Department (HOSPITAL_COMMUNITY)
Admission: EM | Admit: 2019-08-14 | Discharge: 2019-08-15 | Discharge status: Against Medical Advice | Payer: 59 | Attending: Emergency Medicine | Admitting: Emergency Medicine

## 2019-08-14 ENCOUNTER — Emergency Department (HOSPITAL_COMMUNITY): Payer: 59

## 2019-08-14 ENCOUNTER — Encounter (HOSPITAL_COMMUNITY): Payer: Self-pay

## 2019-08-14 DIAGNOSIS — Z5321 Procedure and treatment not carried out due to patient leaving prior to being seen by health care provider: Secondary | ICD-10-CM | POA: Diagnosis not present

## 2019-08-14 DIAGNOSIS — R079 Chest pain, unspecified: Secondary | ICD-10-CM | POA: Insufficient documentation

## 2019-08-14 LAB — I-STAT BETA HCG BLOOD, ED (MC, WL, AP ONLY): I-stat hCG, quantitative: <5 m[IU]/mL (ref <5)

## 2019-08-14 LAB — CBC
HCT: 44.3 % (ref 36.0–46.0)
Hemoglobin: 14.4 g/dL (ref 12.0–15.0)
MCH: 29.9 pg (ref 26.0–34.0)
MCHC: 32.5 g/dL (ref 30.0–36.0)
MCV: 92.1 fL (ref 80.0–100.0)
Platelets: 253 10*3/uL (ref 150–400)
RBC: 4.81 MIL/uL (ref 3.87–5.11)
RDW: 13.5 % (ref 11.5–15.5)
WBC: 9.4 10*3/uL (ref 4.0–10.5)
nRBC: 0 % (ref 0.0–0.2)

## 2019-08-14 MED ORDER — SODIUM CHLORIDE 0.9% FLUSH: 3.0000 mL | Freq: Once | INTRAVENOUS | Status: DC

## 2019-08-14 NOTE — ED Triage Notes (Signed)
Onset 3 days chest pain.  Worse with laying down.  No cough/cold symptoms or shortness of breath.

## 2019-08-15 LAB — BASIC METABOLIC PANEL
Anion gap: 9 (ref 5–15)
BUN: 13 mg/dL (ref 6–20)
CO2: 23 mmol/L (ref 22–32)
Calcium: 8.9 mg/dL (ref 8.9–10.3)
Chloride: 108 mmol/L (ref 98–111)
Creatinine, Ser: 1.1 mg/dL — ABNORMAL HIGH (ref 0.44–1.00)
GFR calc Af Amer: >60 mL/min (ref >60)
GFR calc non Af Amer: >60 mL/min (ref >60)
Glucose, Bld: 71 mg/dL (ref 70–99)
Potassium: 3.8 mmol/L (ref 3.5–5.1)
Sodium: 140 mmol/L (ref 135–145)

## 2019-08-15 LAB — TROPONIN I (HIGH SENSITIVITY): Troponin I (High Sensitivity): <2 ng/L (ref <18)

## 2019-08-15 NOTE — ED Notes (Signed)
Pt left AMA. Pt was encouraged to stay and was aware of risks.  

## 2019-10-24 ENCOUNTER — Telehealth: Payer: Self-pay | Admitting: General Practice

## 2019-10-24 NOTE — Telephone Encounter (Signed)
Casey Underwood called to learn more about becoming a new pt with [facility]. She was called back on 3/17 and 3/18 but the calls could not be completed

## 2019-10-30 ENCOUNTER — Emergency Department: Payer: 59

## 2019-10-30 ENCOUNTER — Other Ambulatory Visit: Payer: Self-pay

## 2019-10-30 ENCOUNTER — Emergency Department
Admission: EM | Admit: 2019-10-30 | Discharge: 2019-10-31 | Disposition: A | Discharge status: Other Acute Inpatient Hospital | Payer: 59 | Attending: Student in an Organized Health Care Education/Training Program | Admitting: Student in an Organized Health Care Education/Training Program

## 2019-10-30 ENCOUNTER — Encounter: Payer: Self-pay | Admitting: *Deleted

## 2019-10-30 DIAGNOSIS — Z79899 Other long term (current) drug therapy: Secondary | ICD-10-CM | POA: Diagnosis not present

## 2019-10-30 DIAGNOSIS — N760 Acute vaginitis: Secondary | ICD-10-CM | POA: Diagnosis present

## 2019-10-30 DIAGNOSIS — Z20822 Contact with and (suspected) exposure to covid-19: Secondary | ICD-10-CM | POA: Insufficient documentation

## 2019-10-30 DIAGNOSIS — B9689 Other specified bacterial agents as the cause of diseases classified elsewhere: Secondary | ICD-10-CM | POA: Diagnosis present

## 2019-10-30 DIAGNOSIS — F319 Bipolar disorder, unspecified: Secondary | ICD-10-CM | POA: Diagnosis not present

## 2019-10-30 DIAGNOSIS — F1910 Other psychoactive substance abuse, uncomplicated: Secondary | ICD-10-CM | POA: Diagnosis not present

## 2019-10-30 DIAGNOSIS — F17210 Nicotine dependence, cigarettes, uncomplicated: Secondary | ICD-10-CM | POA: Insufficient documentation

## 2019-10-30 DIAGNOSIS — R451 Restlessness and agitation: Secondary | ICD-10-CM

## 2019-10-30 DIAGNOSIS — N12 Tubulo-interstitial nephritis, not specified as acute or chronic: Secondary | ICD-10-CM | POA: Diagnosis present

## 2019-10-30 DIAGNOSIS — F22 Delusional disorders: Secondary | ICD-10-CM

## 2019-10-30 LAB — CBC WITH DIFFERENTIAL/PLATELET
Abs Immature Granulocytes: 0.06 10*3/uL (ref 0.00–0.07)
Basophils Absolute: 0 10*3/uL (ref 0.0–0.1)
Basophils Relative: 0 %
Eosinophils Absolute: 0.6 10*3/uL — ABNORMAL HIGH (ref 0.0–0.5)
Eosinophils Relative: 5 %
HCT: 45.9 % (ref 36.0–46.0)
Hemoglobin: 15.5 g/dL — ABNORMAL HIGH (ref 12.0–15.0)
Immature Granulocytes: 1 %
Lymphocytes Relative: 46 %
Lymphs Abs: 5.3 10*3/uL — ABNORMAL HIGH (ref 0.7–4.0)
MCH: 30.2 pg (ref 26.0–34.0)
MCHC: 33.8 g/dL (ref 30.0–36.0)
MCV: 89.5 fL (ref 80.0–100.0)
Monocytes Absolute: 0.9 10*3/uL (ref 0.1–1.0)
Monocytes Relative: 8 %
Neutro Abs: 4.5 10*3/uL (ref 1.7–7.7)
Neutrophils Relative %: 40 %
Platelets: 221 10*3/uL (ref 150–400)
RBC: 5.13 MIL/uL — ABNORMAL HIGH (ref 3.87–5.11)
RDW: 13 % (ref 11.5–15.5)
WBC: 11.3 10*3/uL — ABNORMAL HIGH (ref 4.0–10.5)
nRBC: 0 % (ref 0.0–0.2)

## 2019-10-30 LAB — COMPREHENSIVE METABOLIC PANEL
ALT: 10 U/L (ref 0–44)
AST: 16 U/L (ref 15–41)
Albumin: 4.4 g/dL (ref 3.5–5.0)
Alkaline Phosphatase: 89 U/L (ref 38–126)
Anion gap: 8 (ref 5–15)
BUN: 19 mg/dL (ref 6–20)
CO2: 26 mmol/L (ref 22–32)
Calcium: 9.4 mg/dL (ref 8.9–10.3)
Chloride: 104 mmol/L (ref 98–111)
Creatinine, Ser: 1.17 mg/dL — ABNORMAL HIGH (ref 0.44–1.00)
GFR calc Af Amer: >60 mL/min (ref >60)
GFR calc non Af Amer: >60 mL/min (ref >60)
Glucose, Bld: 107 mg/dL — ABNORMAL HIGH (ref 70–99)
Potassium: 4.4 mmol/L (ref 3.5–5.1)
Sodium: 138 mmol/L (ref 135–145)
Total Bilirubin: 0.4 mg/dL (ref 0.3–1.2)
Total Protein: 8.2 g/dL — ABNORMAL HIGH (ref 6.5–8.1)

## 2019-10-30 LAB — ETHANOL: Alcohol, Ethyl (B): <10 mg/dL (ref <10)

## 2019-10-30 LAB — URINE DRUG SCREEN, QUALITATIVE (ARMC ONLY)
Amphetamines, Ur Screen: NOT DETECTED
Barbiturates, Ur Screen: NOT DETECTED
Benzodiazepine, Ur Scrn: NOT DETECTED
Cannabinoid 50 Ng, Ur ~~LOC~~: POSITIVE — AB
Cocaine Metabolite,Ur ~~LOC~~: POSITIVE — AB
MDMA (Ecstasy)Ur Screen: NOT DETECTED
Methadone Scn, Ur: NOT DETECTED
Opiate, Ur Screen: NOT DETECTED
Phencyclidine (PCP) Ur S: NOT DETECTED
Tricyclic, Ur Screen: NOT DETECTED

## 2019-10-30 LAB — URINALYSIS, COMPLETE (UACMP) WITH MICROSCOPIC
Bacteria, UA: NONE SEEN
Bilirubin Urine: NEGATIVE
Glucose, UA: NEGATIVE mg/dL
Hgb urine dipstick: NEGATIVE
Ketones, ur: NEGATIVE mg/dL
Nitrite: NEGATIVE
Protein, ur: NEGATIVE mg/dL
Specific Gravity, Urine: 1.004 — ABNORMAL LOW (ref 1.005–1.030)
pH: 5 (ref 5.0–8.0)

## 2019-10-30 LAB — ACETAMINOPHEN LEVEL: Acetaminophen (Tylenol), Serum: <10 ug/mL — ABNORMAL LOW (ref 10–30)

## 2019-10-30 LAB — POCT PREGNANCY, URINE: Preg Test, Ur: NEGATIVE

## 2019-10-30 LAB — SALICYLATE LEVEL: Salicylate Lvl: <7 mg/dL — ABNORMAL LOW (ref 7.0–30.0)

## 2019-10-30 MED ORDER — SODIUM CHLORIDE 0.9 % IV BOLUS
1000.0000 mL | Freq: Once | INTRAVENOUS | Status: AC
  Administered 2019-10-30: 1000 mL via INTRAVENOUS

## 2019-10-30 MED ORDER — SODIUM CHLORIDE 0.9 % IV BOLUS
500.0000 mL | Freq: Once | INTRAVENOUS | Status: AC
  Administered 2019-10-30: 500 mL via INTRAVENOUS

## 2019-10-30 MED ORDER — LORAZEPAM 2 MG/ML IJ SOLN
1.0000 mg | Freq: Once | INTRAMUSCULAR | Status: AC
  Administered 2019-10-30: 1 mg via INTRAVENOUS
  Filled 2019-10-30: qty 1

## 2019-10-30 NOTE — ED Notes (Signed)
Rose hoop earrings Link yellow necklace 160.00 cash in 20 dollar bills Black bralette Black scarf Black 1 piece romper with holes in the buttocks. White Economist in a bottle
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
## 2019-10-30 NOTE — ED Triage Notes (Signed)
Pt drank clorox water mixed with coco lotion at 8am. Pt denies any suicidal ideation and is emotional and worried.  Per ems she had an edible (thc) an hours ago and became very paranoid, she is crying and states that she does not want to die.  Pt states that she drank the lotion and water, chlorox mixture as she thought it was alcohol.

## 2019-10-30 NOTE — ED Notes (Signed)
Pt sleeping.  nsr on monitor.  
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
## 2019-10-30 NOTE — ED Provider Notes (Signed)
Emergency Medicine Observation Re-evaluation Note  Casey Underwood is a 24 y.o. female, seen on rounds today.  Pt initially presented to the ED for complaints of Ingestion Currently, the patient is is no acute distress.   Physical Exam  BP (!) 90/59   Pulse 74   Temp 98.2 F (36.8 C) (Oral)   Resp 11   Wt 54.4 kg   SpO2 95%   BMI 25.97 kg/m  Physical Exam  Patient is asleep in bed which precludes further examination.  Patient does not appear to be in any distress.  ED Course / MDM     I have reviewed the labs performed to date as well as medications administered while in observation.  Recent changes in the last 24 hours include no new changes. Plan  Current plan is for sober reevaluation.  Evaluation by psychiatric team. Patient is under full IVC at this time.   Concha Se, MD 10/31/19 [phone]

## 2019-10-30 NOTE — ED Notes (Signed)
Pt sleeping.  Iv fluids infusing.  nsr on monitor.

## 2019-10-30 NOTE — ED Provider Notes (Signed)
[facility] Emergency Department Provider Note    First MD Initiated Contact with Patient 10/30/19 1644     (approximate)  I have reviewed the triage vital signs and the nursing notes.   HISTORY  Chief Complaint Ingestion  Level V Caveat:  AMS  HPI Casey Underwood is a 24 y.o. female with no significant past medical history presents to the ER belligerent agitated crying after reported ingestion.  Patient very poor historian.  She is yelling at the phone stating that she does not want to die but telling her family members on cell phone that she is dying.  In between outbursts states that she was poisoned.  Per EMS report there is question that she had possibly ingested "edible THC" they also have with them a bottle of cocoa Vaseline lotion and a cleaning solution that is half empty.  Solution in the container is reportedly not the same as the labeled solution.  Patient states that she ingested this last night.  Did consent to talking to family members.  Spoke with patient's friend who was with her last night states that they were drinking vodka and tequila that he last saw her earlier this morning and she was acting at her baseline.   Past Medical History:  Diagnosis Date  . Herpes simplex   . Pelvic inflammatory disease    No family history on file. Past Surgical History:  Procedure Laterality Date  . VAGINAL DELIVERY     Patient Active Problem List   Diagnosis Date Noted  . BV (bacterial vaginosis) 04/28/2019  . Pyelonephritis 04/28/2019      Prior to Admission medications   Medication Sig Start Date End Date Taking? Authorizing Provider  naproxen (NAPROSYN) 500 MG tablet Take 1 tablet (500 mg total) by mouth 2 (two) times daily with a meal. 07/12/19   Tommi Rumps, PA-C  traMADol (ULTRAM) 50 MG tablet Take 1 tablet (50 mg total) by mouth every 6 (six) hours as needed. 07/12/19   Tommi Rumps, PA-C    Allergies Patient has no known  allergies.    Social History Social History   Tobacco Use  . Smoking status: Current Some Day Smoker    Types: Cigarettes  . Smokeless tobacco: Never Used  Substance Use Topics  . Alcohol use: Yes  . Drug use: Never    Review of Systems Patient denies headaches, rhinorrhea, blurry vision, numbness, shortness of breath, chest pain, edema, cough, abdominal pain, nausea, vomiting, diarrhea, dysuria, fevers, rashes or hallucinations unless otherwise stated above in HPI. ____________________________________________   PHYSICAL EXAM:  VITAL SIGNS: Vitals:   10/30/19 2000 10/30/19 2030  BP: 95/66 (!) 90/58  Pulse: 86 91  Resp: (!) 0 (!) 5  Temp:    SpO2: 96% 96%    Constitutional: Alert, sobbing, agitated, Eyes: Conjunctivae are normal.  Head: Atraumatic. Nose: No congestion/rhinnorhea. Mouth/Throat: Mucous membranes are moist.   Neck: No stridor. Painless ROM.  Cardiovascular: Normal rate, regular rhythm. Grossly normal heart sounds.  Good peripheral circulation. Respiratory: Normal respiratory effort.  No retractions. Lungs CTAB. Gastrointestinal: Soft and nontender. No distention. No abdominal bruits. No CVA tenderness. Genitourinary:  Musculoskeletal: No lower extremity tenderness nor edema.  No joint effusions. Neurologic:  No gross focal neurologic deficits are appreciated. No facial droop Skin:  Skin is warm, dry and intact. No rash noted. Psychiatric: acute agitation, tearful, paranoid, appears intoxicate but doesn't smell of alcohol ____________________________________________   LABS (all labs ordered are listed, but  only abnormal results are displayed)  Results for orders placed or performed during the hospital encounter of 10/30/19 (from the past 24 hour(s))  CBC with Differential/Platelet     Status: Abnormal   Collection Time: 10/30/19  5:06 PM  Result Value Ref Range   WBC 11.3 (H) 4.0 - 10.5 K/uL   RBC 5.13 (H) 3.87 - 5.11 MIL/uL   Hemoglobin 15.5 (H)  12.0 - 15.0 g/dL   HCT 45.9 36.0 - 46.0 %   MCV 89.5 80.0 - 100.0 fL   MCH 30.2 26.0 - 34.0 pg   MCHC 33.8 30.0 - 36.0 g/dL   RDW 13.0 11.5 - 15.5 %   Platelets 221 150 - 400 K/uL   nRBC 0.0 0.0 - 0.2 %   Neutrophils Relative % 40 %   Neutro Abs 4.5 1.7 - 7.7 K/uL   Lymphocytes Relative 46 %   Lymphs Abs 5.3 (H) 0.7 - 4.0 K/uL   Monocytes Relative 8 %   Monocytes Absolute 0.9 0.1 - 1.0 K/uL   Eosinophils Relative 5 %   Eosinophils Absolute 0.6 (H) 0.0 - 0.5 K/uL   Basophils Relative 0 %   Basophils Absolute 0.0 0.0 - 0.1 K/uL   Immature Granulocytes 1 %   Abs Immature Granulocytes 0.06 0.00 - 0.07 K/uL  Comprehensive metabolic panel     Status: Abnormal   Collection Time: 10/30/19  5:06 PM  Result Value Ref Range   Sodium 138 135 - 145 mmol/L   Potassium 4.4 3.5 - 5.1 mmol/L   Chloride 104 98 - 111 mmol/L   CO2 26 22 - 32 mmol/L   Glucose, Bld 107 (H) 70 - 99 mg/dL   BUN 19 6 - 20 mg/dL   Creatinine, Ser 1.17 (H) 0.44 - 1.00 mg/dL   Calcium 9.4 8.9 - 10.3 mg/dL   Total Protein 8.2 (H) 6.5 - 8.1 g/dL   Albumin 4.4 3.5 - 5.0 g/dL   AST 16 15 - 41 U/L   ALT 10 0 - 44 U/L   Alkaline Phosphatase 89 38 - 126 U/L   Total Bilirubin 0.4 0.3 - 1.2 mg/dL   GFR calc non Af Amer >60 >60 mL/min   GFR calc Af Amer >60 >60 mL/min   Anion gap 8 5 - 15  Urine Drug Screen, Qualitative ([facility] only)     Status: Abnormal   Collection Time: 10/30/19  7:06 PM  Result Value Ref Range   Tricyclic, Ur Screen NONE DETECTED NONE DETECTED   Amphetamines, Ur Screen NONE DETECTED NONE DETECTED   MDMA (Ecstasy)Ur Screen NONE DETECTED NONE DETECTED   Cocaine Metabolite,Ur [place] POSITIVE (A) NONE DETECTED   Opiate, Ur Screen NONE DETECTED NONE DETECTED   Phencyclidine (PCP) Ur S NONE DETECTED NONE DETECTED   Cannabinoid 50 Ng, Ur  POSITIVE (A) NONE DETECTED   Barbiturates, Ur Screen NONE DETECTED NONE DETECTED   Benzodiazepine, Ur Scrn NONE DETECTED NONE DETECTED   Methadone Scn, Ur NONE DETECTED NONE  DETECTED  Urinalysis, Complete w Microscopic     Status: Abnormal   Collection Time: 10/30/19  7:06 PM  Result Value Ref Range   Color, Urine STRAW (A) YELLOW   APPearance CLEAR (A) CLEAR   Specific Gravity, Urine 1.004 (L) 1.005 - 1.030   pH 5.0 5.0 - 8.0   Glucose, UA NEGATIVE NEGATIVE mg/dL   Hgb urine dipstick NEGATIVE NEGATIVE   Bilirubin Urine NEGATIVE NEGATIVE   Ketones, ur NEGATIVE NEGATIVE mg/dL   Protein, ur NEGATIVE  NEGATIVE mg/dL   Nitrite NEGATIVE NEGATIVE   Leukocytes,Ua TRACE (A) NEGATIVE   RBC / HPF 0-5 0 - 5 RBC/hpf   WBC, UA 0-5 0 - 5 WBC/hpf   Bacteria, UA NONE SEEN NONE SEEN   Squamous Epithelial / LPF 0-5 0 - 5  Ethanol     Status: None   Collection Time: 10/30/19  7:06 PM  Result Value Ref Range   Alcohol, Ethyl (B) <10 <10 mg/dL  Acetaminophen level     Status: Abnormal   Collection Time: 10/30/19  7:06 PM  Result Value Ref Range   Acetaminophen (Tylenol), Serum <10 (L) 10 - 30 ug/mL  Salicylate level     Status: Abnormal   Collection Time: 10/30/19  7:06 PM  Result Value Ref Range   Salicylate Lvl <7.0 (L) 7.0 - 30.0 mg/dL  Pregnancy, urine POC     Status: None   Collection Time: 10/30/19  7:37 PM  Result Value Ref Range   Preg Test, Ur NEGATIVE NEGATIVE   ____________________________________________  EKG My review and personal interpretation at Time: 20:13   Indication: ams  Rate: 80  Rhythm: sinus Axis: normal Other: normal intervals, no stemi or depressions ____________________________________________  RADIOLOGY  I personally reviewed all radiographic images ordered to evaluate for the above acute complaints and reviewed radiology reports and findings.  These findings were personally discussed with the patient.  Please see medical record for radiology report.  ____________________________________________   PROCEDURES  Procedure(s) performed:  .Critical Care Performed by: Willy Eddy, MD Authorized by: Willy Eddy, MD    Critical care provider statement:    Critical care time (minutes):  30   Critical care was necessary to treat or prevent imminent or life-threatening deterioration of the following conditions:  Toxidrome   Critical care was time spent personally by me on the following activities:  Discussions with consultants, evaluation of patient's response to treatment, examination of patient, ordering and performing treatments and interventions, ordering and review of laboratory studies, ordering and review of radiographic studies, pulse oximetry, re-evaluation of patient's condition, obtaining history from patient or surrogate and review of old charts      Critical Care performed: yes ____________________________________________   INITIAL IMPRESSION / ASSESSMENT AND PLAN / ED COURSE  Pertinent labs & imaging results that were available during my care of the patient were reviewed by me and considered in my medical decision making (see chart for details).   DDX: Psychosis, delirium, medication effect, noncompliance, polysubstance abuse, Si, Hi, depression   Casey Underwood is a 24 y.o. who presents to the ED with acute agitation and paranoia.  Very poor historian.  Appears on the influence of some substance.  There are mixed reports of some possible intentional ingestion.  Will try to reach family and obtain more collateral.  She is currently protecting her airway.  No evidence of oropharyngeal inflammation erythema or burn.  Lower suspicion for bleach ingestion.  Per boyfriend sounds like she certainly had been drinking alcohol and possibly some other substances involved. Have discussed with the patient and available family all diagnostics and treatments performed thus far and all questions were answered to the best of my ability. The patient demonstrates understanding and agreement with plan.    Clinical Course as of Oct 30 2219  Wed Oct 30, 2019  1806 Have made multiple phone calls to the  patient's mother and left messages.  I did speak with the patient's father.  He was unable to provide any additional  history.  She remains hemodynamically stable may be a little bit dry she is receiving IV fluids.  She is protecting her airway.  Clinically just appears intoxicated.  No but metabolic acidosis.  Anion gap is normal.   [PR]  1847 Patient reassessed.  Resting comfortably.  No hypoxia on room air.   [PR]  2009 UDS is cocaine and cannabinoid positive.  Suspect that this is the source of her bizarre behavior.  Blood work is otherwise reassuring.  We will continue to observe in the ER.   [PR]  2018 Discussed case with poison control.  No further recommendations at this point.  We will continue hold in the ER for psych evaluation.   [PR]    Clinical Course User Index [PR] Willy Eddy, MD    The patient was evaluated in Emergency Department today for the symptoms described in the history of present illness. He/she was evaluated in the context of the global COVID-19 pandemic, which necessitated consideration that the patient might be at risk for infection with the SARS-CoV-2 virus that causes COVID-19. Institutional protocols and algorithms that pertain to the evaluation of patients at risk for COVID-19 are in a state of rapid change based on information released by regulatory bodies including the CDC and federal and state organizations. These policies and algorithms were followed during the patient's care in the ED.  As part of my medical decision making, I reviewed the following data within the electronic MEDICAL RECORD NUMBER Nursing notes reviewed and incorporated, Labs reviewed, notes from prior ED visits and [place] Controlled Substance Database   ____________________________________________   FINAL CLINICAL IMPRESSION(S) / ED DIAGNOSES  Final diagnoses:  Agitation  Paranoia (HCC)  Polysubstance abuse (HCC)      NEW MEDICATIONS STARTED DURING THIS VISIT:  New Prescriptions    No medications on file     Note:  This document was prepared using Dragon voice recognition software and may include unintentional dictation errors.    Willy Eddy, MD 10/30/19 2221

## 2019-10-30 NOTE — ED Notes (Signed)
Pt unable to void at this time. 

## 2019-10-30 NOTE — ED Notes (Signed)
This rn spoke with pt's mother and gave an update

## 2019-10-30 NOTE — ED Notes (Signed)
This rn spoke with mother Gabriel Rung [phone]) and gave an update via telephone.

## 2019-10-30 NOTE — ED Notes (Signed)
Pt stood up and voided in the floor.  Pt sleepy.  Iv in place  nsr on monitor. md at bedside

## 2019-10-30 NOTE — ED Notes (Signed)
IVC/ Consult will be ordered when medically clear/ RN & LEO are aware of Legal status

## 2019-10-31 ENCOUNTER — Encounter: Payer: Self-pay | Admitting: Behavioral Health

## 2019-10-31 ENCOUNTER — Other Ambulatory Visit: Payer: Self-pay

## 2019-10-31 ENCOUNTER — Inpatient Hospital Stay
Admission: EM | Admit: 2019-10-31 | Discharge: 2019-11-01 | DRG: 897 | Disposition: A | Discharge status: Home / Self Care | Payer: 59 | Source: Intra-hospital | Attending: Psychiatry | Admitting: Psychiatry

## 2019-10-31 DIAGNOSIS — F14151 Cocaine abuse with cocaine-induced psychotic disorder with hallucinations: Principal | ICD-10-CM | POA: Diagnosis present

## 2019-10-31 DIAGNOSIS — F1410 Cocaine abuse, uncomplicated: Secondary | ICD-10-CM

## 2019-10-31 DIAGNOSIS — F17210 Nicotine dependence, cigarettes, uncomplicated: Secondary | ICD-10-CM | POA: Diagnosis present

## 2019-10-31 DIAGNOSIS — F12151 Cannabis abuse with psychotic disorder with hallucinations: Secondary | ICD-10-CM | POA: Diagnosis present

## 2019-10-31 DIAGNOSIS — F1010 Alcohol abuse, uncomplicated: Secondary | ICD-10-CM | POA: Diagnosis present

## 2019-10-31 DIAGNOSIS — F19959 Other psychoactive substance use, unspecified with psychoactive substance-induced psychotic disorder, unspecified: Secondary | ICD-10-CM | POA: Diagnosis present

## 2019-10-31 DIAGNOSIS — F1210 Cannabis abuse, uncomplicated: Secondary | ICD-10-CM

## 2019-10-31 DIAGNOSIS — F319 Bipolar disorder, unspecified: Secondary | ICD-10-CM | POA: Diagnosis present

## 2019-10-31 LAB — RESPIRATORY PANEL BY RT PCR (FLU A&B, COVID)
Influenza A by PCR: NEGATIVE
Influenza B by PCR: NEGATIVE
SARS Coronavirus 2 by RT PCR: NEGATIVE

## 2019-10-31 MED ORDER — HALOPERIDOL 5 MG PO TABS: 5.0000 mg | ORAL_TABLET | Freq: Four times a day (QID) | ORAL | Status: DC | PRN

## 2019-10-31 MED ORDER — HALOPERIDOL LACTATE 5 MG/ML IJ SOLN: 5.0000 mg | Freq: Four times a day (QID) | INTRAMUSCULAR | Status: DC | PRN

## 2019-10-31 MED ORDER — TRAZODONE HCL 100 MG PO TABS: 100.0000 mg | ORAL_TABLET | Freq: Every evening | ORAL | Status: DC | PRN

## 2019-10-31 MED ORDER — TRAZODONE HCL 100 MG PO TABS: 100.0000 mg | ORAL_TABLET | Freq: Every day | ORAL | Status: DC

## 2019-10-31 MED ORDER — ACETAMINOPHEN 325 MG PO TABS: 650.0000 mg | ORAL_TABLET | Freq: Four times a day (QID) | ORAL | Status: DC | PRN

## 2019-10-31 MED ORDER — ALUM & MAG HYDROXIDE-SIMETH 200-200-20 MG/5ML PO SUSP: 30.0000 mL | ORAL | Status: DC | PRN

## 2019-10-31 MED ORDER — MAGNESIUM HYDROXIDE 400 MG/5ML PO SUSP: 30.0000 mL | Freq: Every day | ORAL | Status: DC | PRN

## 2019-10-31 NOTE — ED Notes (Signed)
TTS and NP talking to patient at this time in room #23A./

## 2019-10-31 NOTE — Plan of Care (Signed)
Patient active on the unit this evening. Patient stated she was feeling better and is hopeful she will leave tomorrow  Problem: Education: Goal: Mental status will improve Outcome: Progressing   Problem: Activity: Goal: Interest or engagement in activities will improve Outcome: Progressing

## 2019-10-31 NOTE — ED Notes (Addendum)
Pt discharged under IVC to BMU.  All belonggins will be sent with patient.  Pt accepting of admission.
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
## 2019-10-31 NOTE — BH Assessment (Addendum)
Assessment Note  Casey Underwood is an 24 y.o. female. Who presents under IVC  with no significant past medical history. Upon  Presentation to the ER pt was belligerent agitated and crying after a reported ingestion.Per EMS report there is question that she had possibly ingested "edible THC" they also have with them a bottle of cocoa Vaseline lotion and a cleaning solution that is half empty.  Solution in the container is reportedly not the same as the labeled solution. The pt states that she usually drinks alcohol heavily and that her last drink was today.  No previous mental health issues reported. No current or previous outpatient provider reported. No previous inpatient hospitalizations reported. Pt. denies any suicidal ideation, plan or intent.  She reports a history of suicide attempts. She also reports a history of drug use. She states that she actively abuse crack cocaine for approximately eight months, and reports quitting  last October 2020. Per her report she began to use agian this February and reports last use approximately five days ago. Patient reports that she currently lives with her mother. Per her report her mother is dx with schizophrenic disorder. She has a 71 year old son who is in the custody of her grandmother. She is currently unemployed. Pt. Endorses the presence of both auditory hallucinations In the form of "footsteps and other sounds". Patient states "yes I hear footsteps but I ignore them so that I can function and these people  think I'm normal."She also endorsed experiencing visual hallucinations since the age of 19 or 24 years old. Patient states reports seeing a little girl with long black hair. She shares that this person will give her signs of things that are to come. He denies any changes in sleeping patterns or appetite. Writer is concerned for Pts safety and the safety of others. Pt requires inpatient psychiatric hospitalization for safety common stabilization, and  medication management.  Diagnosis: Bipolar Didsorder   Past Medical History:  Past Medical History:  Diagnosis Date  . Herpes simplex   . Pelvic inflammatory disease     Past Surgical History:  Procedure Laterality Date  . VAGINAL DELIVERY      Family History: No family history on file.  Social History:  reports that she has been smoking cigarettes. She has never used smokeless tobacco. She reports current alcohol use. She reports that she does not use drugs.  Additional Social History:  Alcohol / Drug Use Pain Medications: SEE PTA Prescriptions: SEE PTA Over the Counter: SEE PTA History of alcohol / drug use?: Yes Longest period of sobriety (when/how long): 8 months Substance #1 Name of Substance 1: Alcohol 1 - Age of First Use: unk 1 - Amount (size/oz): heavy use, per pt 1 - Frequency: daily 1 - Duration: ongoing 1 - Last Use / Amount: PTA Substance #2 Name of Substance 2: Cocaine 2 - Age of First Use: 22 2 - Amount (size/oz): unk 2 - Frequency: unk 2 - Duration: 2 months 2 - Last Use / Amount: 5 days ago  CIWA: CIWA-Ar BP: 125/78 Pulse Rate: 83 COWS:    Allergies: No Known Allergies  Home Medications: (Not in a hospital admission)   OB/GYN Status:  No LMP recorded. Patient has had an implant.  General Assessment Data Assessment unable to be completed: Yes Reason for not completing assessment: unresponsive  Location of Assessment: [facility] ED TTS Assessment: In system Is this a Tele or Face-to-Face Assessment?: Face-to-Face Is this an Initial Assessment or a Re-assessment  for this encounter?: Initial Assessment Patient Accompanied by:: N/A Language Other than English: No Living Arrangements: Other (Comment) What gender do you identify as?: Female Marital status: Single Living Arrangements: Other relatives, Parent Can pt return to current living arrangement?: Yes Admission Status: Involuntary Petitioner: Other Is patient capable of signing voluntary  admission?: No Referral Source: Other Insurance type: Designer, industrial/product Exam [facility] Walk-in ONLY) Medical Exam completed: Yes  Crisis Care Plan Living Arrangements: Other relatives, Parent Legal Guardian: Other:(Self) Name of Psychiatrist: None  Name of Therapist: None   Education Status Is patient currently in school?: No Is the patient employed, unemployed or receiving disability?: Unemployed  Risk to self with the past 6 months Suicidal Ideation: No Has patient been a risk to self within the past 6 months prior to admission? : No Suicidal Intent: No Has patient had any suicidal intent within the past 6 months prior to admission? : No Is patient at risk for suicide?: No, but patient needs Medical Clearance Suicidal Plan?: No Has patient had any suicidal plan within the past 6 months prior to admission? : No Access to Means: No What has been your use of drugs/alcohol within the last 12 months?: THC, Cocaine and Alcohol Previous Attempts/Gestures: No How many times?: 2 Other Self Harm Risks: drug use  Triggers for Past Attempts: None known Intentional Self Injurious Behavior: None Family Suicide History: Unknown Recent stressful life event(s): Conflict (Comment), Financial Problems, Job Loss Persecutory voices/beliefs?: No Depression: No Substance abuse history and/or treatment for substance abuse?: Yes Suicide prevention information given to non-admitted patients: Not applicable  Risk to Others within the past 6 months Homicidal Ideation: No Does patient have any lifetime risk of violence toward others beyond the six months prior to admission? : No Thoughts of Harm to Others: No Current Homicidal Intent: No Current Homicidal Plan: No Access to Homicidal Means: No History of harm to others?: No Assessment of Violence: None Noted Does patient have access to weapons?: No Criminal Charges Pending?: No Does patient have a court date: No Is patient on probation?:  No  Psychosis Hallucinations: Auditory, Visual Delusions: Unspecified  Mental Status Report Appearance/Hygiene: In scrubs Eye Contact: Fair Motor Activity: Freedom of movement Speech: Pressured Level of Consciousness: Alert Mood: Suspicious Affect: Anxious, Irritable Anxiety Level: Moderate Thought Processes: Tangential Judgement: Partial Orientation: Time, Place, Person, Situation Obsessive Compulsive Thoughts/Behaviors: Minimal  Cognitive Functioning Concentration: Poor Memory: Remote Intact, Recent Intact Is patient IDD: No Insight: Poor Impulse Control: Poor Appetite: Fair Have you had any weight changes? : No Change Sleep: No Change Total Hours of Sleep: 6 Vegetative Symptoms: None  ADLScreening [facility] Assessment Services) Patient's cognitive ability adequate to safely complete daily activities?: Yes Patient able to express need for assistance with ADLs?: Yes Independently performs ADLs?: Yes (appropriate for developmental age)  Prior Inpatient Therapy Prior Inpatient Therapy: No  Prior Outpatient Therapy Prior Outpatient Therapy: No Does patient have an ACCT team?: No Does patient have Intensive In-House Services?  : No Does patient have [facility] services? : No Does patient have P4CC services?: No  ADL Screening (condition at time of admission) Patient's cognitive ability adequate to safely complete daily activities?: Yes Patient able to express need for assistance with ADLs?: Yes Independently performs ADLs?: Yes (appropriate for developmental age)       Abuse/Neglect Assessment (Assessment to be complete while patient is alone) Abuse/Neglect Assessment Can Be Completed: Yes Physical Abuse: Denies Verbal Abuse: Denies Sexual Abuse: Denies Exploitation of patient/patient's resources: Denies Self-Neglect:  Denies Values / Beliefs Cultural Requests During Hospitalization: None Spiritual Requests During Hospitalization: None Consults Spiritual Care  Consult Needed: No Transition of Care Team Consult Needed: No Advance Directives (For Healthcare) Does Patient Have a Medical Advance Directive?: No          Disposition:  Disposition Initial Assessment Completed for this Encounter: Yes Disposition of Patient: Admit  On Site Evaluation by:   Reviewed with Physician:    Laretta Alstrom 10/31/2019 4:00 AM

## 2019-10-31 NOTE — Progress Notes (Signed)
Recreation Therapy Notes  INPATIENT RECREATION THERAPY ASSESSMENT  Patient Details Name: LATANGA NEDROW MRN: [id number] DOB: 07-12-1996 Today's Date: 10/31/2019       Information Obtained From: Patient  Able to Participate in Assessment/Interview: Yes  Patient Presentation: Responsive  Reason for Admission (Per Patient): Active Symptoms, Substance Abuse  Patient Stressors:    Coping Skills:   Substance Abuse, Talk  Leisure Interests (2+):  Social - Friends, Social - Family, Music - Listen  Frequency of Recreation/Participation: Monthly  Awareness of Community Resources:     [employer]:     Current Use:    If no, Barriers?:    Expressed Interest in [employer] Information:    [place] of Residence:  Film/video editor  Patient Main Form of Transportation: Set designer  Patient Strengths:  My attitude, giving  Patient Identified Areas of Improvement:  Do more stuff for myself  Patient Goal for Hospitalization:  To go home  Current SI (including self-harm):  No  Current HI:  No  Current AVH: No  Staff Intervention Plan: Collaborate with Interdisciplinary Treatment Team, Group Attendance  Consent to Intern Participation: N/A  Manson Luckadoo 10/31/2019, 2:17 PM

## 2019-10-31 NOTE — H&P (Signed)
Psychiatric Admission Assessment Adult  Patient Identification: Casey Underwood MRN:  [id number] Date of Evaluation:  10/31/2019 Chief Complaint:  Bipolar 1 disorder (HCC) [F31.9] Principal Diagnosis: Psychoactive substance-induced psychosis (HCC) Diagnosis:  Principal Problem:   Psychoactive substance-induced psychosis (HCC) Active Problems:   Bipolar 1 disorder (HCC)   Alcohol abuse   Cocaine abuse (HCC)   Cannabis abuse  History of Present Illness: Patient seen chart reviewed.  24 year old woman who came into the hospital early in the morning confused agitated disorganized.  Reported at that time that she had drunk some kind of combination of cleaning fluids and felt like she had been poisoned.  Patient tells me that on the day prior she had been drinking with a friend.  That friend had made her drink something that turned out to be a mixture of cleaning fluids and hand lotion apparently made by her son.  Patient tells me at the time she had no symptoms from it but the following day she consumed edible THC product and started "foaming at the mouth".  Specifically she says that she felt like her throat was closing up and burning and that is why she called an ambulance.  Patient is largely uncooperative with most psychiatric history.  In the emergency room is documented that she admitted having chronic auditory and visual hallucinations.  With me she states that her mood recently had been fine.  Denies any sleep problems.  Denies suicidal or homicidal ideation.  Will not discuss the hallucinations any further.  Patient admits that ever since she turned 23 last November she has been drinking although she declines to tell me how much she has been drinking.  She also admits having relapsed into powder cocaine use in February.  Declines to tell me how much cocaine or marijuana she has been using.  Not currently receiving any mental health treatment.  Patient lives with her mother and her 22-year-old son  although apparently the child is actually in the custody of her grandmother. Associated Signs/Symptoms: Depression Symptoms:  psychomotor agitation, difficulty concentrating, (Hypo) Manic Symptoms:  Impulsivity, Irritable Mood, Labiality of Mood, Anxiety Symptoms:  Excessive Worry, Psychotic Symptoms:  Hallucinations: Auditory Visual PTSD Symptoms: Negative Total Time spent with patient: 1 hour  Past Psychiatric History: Patient denies any past psychiatric history.  Denies suicide attempts.  Denies hospitalization or any psychiatric medicine.  Denies any past substance abuse treatment although it appears from what I can tell in the notes that she may be required to do substance abuse treatment to try to regain custody of her child  Is the patient at risk to self? Yes.    Has the patient been a risk to self in the past 6 months? Yes.    Has the patient been a risk to self within the distant past? No.  Is the patient a risk to others? No.  Has the patient been a risk to others in the past 6 months? No.  Has the patient been a risk to others within the distant past? No.   Prior Inpatient Therapy:   Prior Outpatient Therapy:    Alcohol Screening: 1. How often do you have a drink containing alcohol?: 2 to 4 times a month 2. How many drinks containing alcohol do you have on a typical day when you are drinking?: 1 or 2 3. How often do you have six or more drinks on one occasion?: Less than monthly AUDIT-C Score: 3 4. How often during the last year have you found  that you were not able to stop drinking once you had started?: Never 5. How often during the last year have you failed to do what was normally expected from you becasue of drinking?: Never 6. How often during the last year have you needed a first drink in the morning to get yourself going after a heavy drinking session?: Never 7. How often during the last year have you had a feeling of guilt of remorse after drinking?: Never 8. How  often during the last year have you been unable to remember what happened the night before because you had been drinking?: Never 9. Have you or someone else been injured as a result of your drinking?: No 10. Has a relative or friend or a doctor or another health worker been concerned about your drinking or suggested you cut down?: No Alcohol Use Disorder Identification Test Final Score (AUDIT): 3 Substance Abuse History in the last 12 months:  Yes.   Consequences of Substance Abuse: Medical Consequences:  This presentation to the hospital sounds like it is probably the result of the combination of all the drugs and alcohol and withdrawal that she was having putting her into an agitated delirium of some sort Previous Psychotropic Medications: No  Psychological Evaluations: No  Past Medical History:  Past Medical History:  Diagnosis Date  . Herpes simplex   . Pelvic inflammatory disease     Past Surgical History:  Procedure Laterality Date  . VAGINAL DELIVERY     Family History: History reviewed. No pertinent family history. Family Psychiatric  History: Denies knowing of any Tobacco Screening: Have you used any form of tobacco in the last 30 days? (Cigarettes, Smokeless Tobacco, Cigars, and/or Pipes): Yes Tobacco use, Select all that apply: 5 or more cigarettes per day Are you interested in Tobacco Cessation Medications?: Yes, will notify MD for an order Counseled patient on smoking cessation including recognizing danger situations, developing coping skills and basic information about quitting provided: Refused/Declined practical counseling Social History:  Social History   Substance and Sexual Activity  Alcohol Use Yes     Social History   Substance and Sexual Activity  Drug Use Never    Additional Social History:                           Allergies:  No Known Allergies Lab Results:  Results for orders placed or performed during the hospital encounter of 10/30/19  (from the past 48 hour(s))  CBC with Differential/Platelet     Status: Abnormal   Collection Time: 10/30/19  5:06 PM  Result Value Ref Range   WBC 11.3 (H) 4.0 - 10.5 K/uL   RBC 5.13 (H) 3.87 - 5.11 MIL/uL   Hemoglobin 15.5 (H) 12.0 - 15.0 g/dL   HCT 51.0 25.8 - 52.7 %   MCV 89.5 80.0 - 100.0 fL   MCH 30.2 26.0 - 34.0 pg   MCHC 33.8 30.0 - 36.0 g/dL   RDW 78.2 42.3 - 53.6 %   Platelets 221 150 - 400 K/uL   nRBC 0.0 0.0 - 0.2 %   Neutrophils Relative % 40 %   Neutro Abs 4.5 1.7 - 7.7 K/uL   Lymphocytes Relative 46 %   Lymphs Abs 5.3 (H) 0.7 - 4.0 K/uL   Monocytes Relative 8 %   Monocytes Absolute 0.9 0.1 - 1.0 K/uL   Eosinophils Relative 5 %   Eosinophils Absolute 0.6 (H) 0.0 - 0.5 K/uL  Basophils Relative 0 %   Basophils Absolute 0.0 0.0 - 0.1 K/uL   Immature Granulocytes 1 %   Abs Immature Granulocytes 0.06 0.00 - 0.07 K/uL    Comment: Performed at [facility], [address] [place], [place] [address]  Comprehensive metabolic panel     Status: Abnormal   Collection Time: 10/30/19  5:06 PM  Result Value Ref Range   Sodium 138 135 - 145 mmol/L   Potassium 4.4 3.5 - 5.1 mmol/L   Chloride 104 98 - 111 mmol/L   CO2 26 22 - 32 mmol/L   Glucose, Bld 107 (H) 70 - 99 mg/dL    Comment: Glucose reference range applies only to samples taken after fasting for at least 8 hours.   BUN 19 6 - 20 mg/dL   Creatinine, Ser 7.03 (H) 0.44 - 1.00 mg/dL   Calcium 9.4 8.9 - 50.0 mg/dL   Total Protein 8.2 (H) 6.5 - 8.1 g/dL   Albumin 4.4 3.5 - 5.0 g/dL   AST 16 15 - 41 U/L   ALT 10 0 - 44 U/L   Alkaline Phosphatase 89 38 - 126 U/L   Total Bilirubin 0.4 0.3 - 1.2 mg/dL   GFR calc non Af Amer >60 >60 mL/min   GFR calc Af Amer >60 >60 mL/min   Anion gap 8 5 - 15    Comment: Performed at [facility], [address] [place], [place] [address]  Urine Drug Screen, Qualitative ([facility] only)     Status: Abnormal   Collection Time: 10/30/19  7:06 PM  Result Value Ref Range    Tricyclic, Ur Screen NONE DETECTED NONE DETECTED   Amphetamines, Ur Screen NONE DETECTED NONE DETECTED   MDMA (Ecstasy)Ur Screen NONE DETECTED NONE DETECTED   Cocaine Metabolite,Ur [place] POSITIVE (A) NONE DETECTED   Opiate, Ur Screen NONE DETECTED NONE DETECTED   Phencyclidine (PCP) Ur S NONE DETECTED NONE DETECTED   Cannabinoid 50 Ng, Ur [place] POSITIVE (A) NONE DETECTED   Barbiturates, Ur Screen NONE DETECTED NONE DETECTED   Benzodiazepine, Ur Scrn NONE DETECTED NONE DETECTED   Methadone Scn, Ur NONE DETECTED NONE DETECTED    Comment: (NOTE) Tricyclics + metabolites, urine    Cutoff 1000 ng/mL Amphetamines + metabolites, urine  Cutoff 1000 ng/mL MDMA (Ecstasy), urine              Cutoff 500 ng/mL Cocaine Metabolite, urine          Cutoff 300 ng/mL Opiate + metabolites, urine        Cutoff 300 ng/mL Phencyclidine (PCP), urine         Cutoff 25 ng/mL Cannabinoid, urine                 Cutoff 50 ng/mL Barbiturates + metabolites, urine  Cutoff 200 ng/mL Benzodiazepine, urine              Cutoff 200 ng/mL Methadone, urine                   Cutoff 300 ng/mL The urine drug screen provides only a preliminary, unconfirmed analytical test result and should not be used for non-medical purposes. Clinical consideration and professional judgment should be applied to any positive drug screen result due to possible interfering substances. A more specific alternate chemical method must be used in order to obtain a confirmed analytical result. Gas chromatography / mass spectrometry (GC/MS) is the preferred confirmat ory method. Performed at [facility], [address] [place], [place] [address]   Urinalysis, Complete w Microscopic     Status: Abnormal   Collection Time: 10/30/19  7:06 PM  Result Value Ref Range   Color, Urine STRAW (A) YELLOW   APPearance CLEAR (A) CLEAR   Specific Gravity, Urine 1.004 (L) 1.005 - 1.030   pH 5.0 5.0 - 8.0   Glucose, UA NEGATIVE NEGATIVE mg/dL   Hgb urine  dipstick NEGATIVE NEGATIVE   Bilirubin Urine NEGATIVE NEGATIVE   Ketones, ur NEGATIVE NEGATIVE mg/dL   Protein, ur NEGATIVE NEGATIVE mg/dL   Nitrite NEGATIVE NEGATIVE   Leukocytes,Ua TRACE (A) NEGATIVE   RBC / HPF 0-5 0 - 5 RBC/hpf   WBC, UA 0-5 0 - 5 WBC/hpf   Bacteria, UA NONE SEEN NONE SEEN   Squamous Epithelial / LPF 0-5 0 - 5    Comment: Performed at [facility], [address] [place], [place] [address]  Ethanol     Status: None   Collection Time: 10/30/19  7:06 PM  Result Value Ref Range   Alcohol, Ethyl (B) <10 <10 mg/dL    Comment: (NOTE) Lowest detectable limit for serum alcohol is 10 mg/dL. For medical purposes only. Performed at [facility], [address] [place], [place] [address]   Acetaminophen level     Status: Abnormal   Collection Time: 10/30/19  7:06 PM  Result Value Ref Range   Acetaminophen (Tylenol), Serum <10 (L) 10 - 30 ug/mL    Comment: (NOTE) Therapeutic concentrations vary significantly. A range of 10-30 ug/mL  may be an effective concentration for many patients. However, some  are best treated at concentrations outside of this range. Acetaminophen concentrations >150 ug/mL at 4 hours after ingestion  and >50 ug/mL at 12 hours after ingestion are often associated with  toxic reactions. Performed at [facility], [address] [place], [place] [address]   Salicylate level     Status: Abnormal   Collection Time: 10/30/19  7:06 PM  Result Value Ref Range   Salicylate Lvl <7.0 (L) 7.0 - 30.0 mg/dL    Comment: Performed at [facility], [address] [place], [place] [address]  Pregnancy, urine POC     Status: None   Collection Time: 10/30/19  7:37 PM  Result Value Ref Range   Preg Test, Ur NEGATIVE NEGATIVE    Comment:        THE SENSITIVITY OF THIS METHODOLOGY IS >24 mIU/mL   Respiratory Panel by RT PCR (Flu A&B, Covid) - Nasopharyngeal Swab     Status: None   Collection Time: 10/31/19  3:34 AM    Specimen: Nasopharyngeal Swab  Result Value Ref Range   SARS Coronavirus 2 by RT PCR NEGATIVE NEGATIVE    Comment: (NOTE) SARS-CoV-2 target nucleic acids are NOT DETECTED. The SARS-CoV-2 RNA is generally detectable in upper respiratoy specimens during the acute phase of infection. The lowest concentration of SARS-CoV-2 viral copies this assay can detect is 131 copies/mL. A negative result does not preclude SARS-Cov-2 infection and should not be used as the sole basis for treatment or other patient management decisions. A negative result may occur with  improper specimen collection/handling, submission of specimen other than nasopharyngeal swab, presence of viral mutation(s) within the areas targeted by this assay, and inadequate number of viral copies (<131 copies/mL). A negative result must be combined with clinical observations, patient history, and epidemiological information. The expected result is Negative. Fact Sheet for Patients:  https://www.moore.com/ Fact Sheet for  Healthcare Providers:  https://www.young.biz/ This test is not yet ap proved or cleared by the [place] and  has been authorized for detection and/or diagnosis of SARS-CoV-2 by FDA under an Emergency Use Authorization (EUA). This EUA will remain  in effect (meaning this test can be used) for the duration of the COVID-19 declaration under Section 564(b)(1) of the Act, 21 U.S.C. section 360bbb-3(b)(1), unless the authorization is terminated or revoked sooner.    Influenza A by PCR NEGATIVE NEGATIVE   Influenza B by PCR NEGATIVE NEGATIVE    Comment: (NOTE) The Xpert Xpress SARS-CoV-2/FLU/RSV assay is intended as an aid in  the diagnosis of influenza from Nasopharyngeal swab specimens and  should not be used as a sole basis for treatment. Nasal washings and  aspirates are unacceptable for Xpert Xpress SARS-CoV-2/FLU/RSV  testing. Fact Sheet for  Patients: https://www.moore.com/ Fact Sheet for Healthcare Providers: https://www.young.biz/ This test is not yet approved or cleared by the [place] FDA and  has been authorized for detection and/or diagnosis of SARS-CoV-2 by  FDA under an Emergency Use Authorization (EUA). This EUA will remain  in effect (meaning this test can be used) for the duration of the  Covid-19 declaration under Section 564(b)(1) of the Act, 21  U.S.C. section 360bbb-3(b)(1), unless the authorization is  terminated or revoked. Performed at [facility], [address] [place], [place] [address]     Blood Alcohol level:  Lab Results  Component Value Date   [facility] <10 10/30/2019   ETH <10 11/06/2017    Metabolic Disorder Labs:  No results found for: HGBA1C, MPG No results found for: PROLACTIN No results found for: CHOL, TRIG, HDL, CHOLHDL, VLDL, LDLCALC  Current Medications: Current Facility-Administered Medications  Medication Dose Route Frequency Provider Last Rate Last Admin  . acetaminophen (TYLENOL) tablet 650 mg  650 mg Oral Q6H PRN Gillermo Murdoch, NP      . alum & mag hydroxide-simeth (MAALOX/MYLANTA) 200-200-20 MG/5ML suspension 30 mL  30 mL Oral Q4H PRN Gillermo Murdoch, NP      . magnesium hydroxide (MILK OF MAGNESIA) suspension 30 mL  30 mL Oral Daily PRN Gillermo Murdoch, NP       PTA Medications: Medications Prior to Admission  Medication Sig Dispense Refill Last Dose  . naproxen (NAPROSYN) 500 MG tablet Take 1 tablet (500 mg total) by mouth 2 (two) times daily with a meal. 30 tablet 0   . traMADol (ULTRAM) 50 MG tablet Take 1 tablet (50 mg total) by mouth every 6 (six) hours as needed. 12 tablet 0     Musculoskeletal: Strength & Muscle Tone: within normal limits Gait & Station: normal Patient leans: N/A  Psychiatric Specialty Exam: Physical Exam  Nursing note and vitals reviewed. Constitutional: She appears  well-developed and well-nourished.  HENT:  Head: Normocephalic and atraumatic.  Eyes: Pupils are equal, round, and reactive to light. Conjunctivae are normal.  Cardiovascular: Regular rhythm and normal heart sounds.  Respiratory: Effort normal. No respiratory distress.  GI: Soft.  Musculoskeletal:        General: Normal range of motion.     Cervical back: Normal range of motion.  Neurological: She is alert.  Skin: Skin is warm and dry.  Psychiatric: Her affect is labile. Her speech is rapid and/or pressured and tangential. She is agitated and aggressive. She is not combative. Thought content is paranoid. Cognition and memory are impaired. She expresses impulsivity and inappropriate judgment. She expresses no homicidal and no suicidal ideation.  Review of Systems  Constitutional: Negative.   HENT: Negative.   Eyes: Negative.   Respiratory: Negative.   Cardiovascular: Negative.   Gastrointestinal: Negative.   Musculoskeletal: Negative.   Skin: Negative.   Neurological: Negative.   Psychiatric/Behavioral: Positive for agitation, dysphoric mood, hallucinations and sleep disturbance.    Blood pressure (!) 121/93, pulse 88, temperature 99 F (37.2 C), temperature source Oral, resp. rate 18, height 5\' 9"  (1.753 m), weight 54.4 kg, SpO2 100 %.Body mass index is 17.72 kg/m.  General Appearance: Disheveled  Eye Contact:  Good  Speech:  Pressured  Volume:  Increased  Mood:  Angry, Anxious and Irritable  Affect:  Inappropriate and Labile  Thought Process:  Disorganized  Orientation:  Full (Time, Place, and Person)  Thought Content:  Illogical and Tangential  Suicidal Thoughts:  No  Homicidal Thoughts:  No  Memory:  Immediate;   Fair Recent;   Poor Remote;   Fair  Judgement:  Impaired  Insight:  Shallow  Psychomotor Activity:  Restlessness  Concentration:  Concentration: Fair  Recall:  [employer] of Knowledge:  Fair  Language:  Fair  Akathisia:  No  Handed:  Right  AIMS (if  indicated):     Assets:  Housing Physical Health Resilience  ADL's:  Intact  Cognition:  Impaired,  Mild  Sleep:       Treatment Plan Summary: Daily contact with patient to assess and evaluate symptoms and progress in treatment, Medication management and Plan 24 year old woman who presented to the emergency room under involuntary commitment extremely agitated and disorganized.  Today the patient presents with labile mood anger inappropriate emotional control.  Refuses to discuss psychiatric history.  Drug screen positive for cannabis and cocaine.  It seems like the most likely explanation for this would be the combination of alcohol and drug abuse although with limited further information cannot rule out bipolar disorder or psychotic disorder.  Patient currently uncooperative and demanding.  We will not discharge her immediately from the hospital but observe at least for another day for stability.  I offered to discussed medication treatment for hallucinations and the patient refused.  As needed orders are in place.  Observation Level/Precautions:  15 minute checks  Laboratory:  UDS  Psychotherapy:    Medications:    Consultations:    Discharge Concerns:    Estimated LOS:  Other:     Physician Treatment Plan for Primary Diagnosis: Psychoactive substance-induced psychosis ([place]) Long Term Goal(s): Improvement in symptoms so as ready for discharge  Short Term Goals: Ability to demonstrate self-control will improve  Physician Treatment Plan for Secondary Diagnosis: Principal Problem:   Psychoactive substance-induced psychosis ([place]) Active Problems:   Bipolar 1 disorder ([place])   Alcohol abuse   Cocaine abuse ([place])   Cannabis abuse  Long Term Goal(s): Improvement in symptoms so as ready for discharge  Short Term Goals: Ability to identify triggers associated with substance abuse/mental health issues will improve  I certify that inpatient services furnished can reasonably be expected to  improve the patient's condition.    Alethia Berthold, MD 3/25/20213:29 PM

## 2019-10-31 NOTE — Progress Notes (Signed)
  Pt arrived on the floor calm and cooperative. Pt was very open during her assessment. She was teary at times. Pt rates depression and anxiety both 10/10. Pt denies SI and HI. At first she said that she wanted "to kill Chrissie Noa" who is the one that she says gave her the concoction that her son made in a wine glass. She then said a minute later that she was not homicidal towards her. She states that she has AVH. AH is positive and negative at times and VH is positive. Her stressors are that her sister is "locked up for murder" and she has "nothing" regarding a house, job or car. She also wants to get her son back from her grandmother. She wants to take the drug classes and get a job. She says that she did not try to kil herself and she doesn't need to be here. Pt was oriented to the unit and ate all of her lunch. Torrie Mayers RN

## 2019-10-31 NOTE — ED Notes (Signed)
Patients mother called to check on status of patient.  Mother told times pt. Able to take and make phone calls.  Mother stated she would call back around 9 a.m

## 2019-10-31 NOTE — Progress Notes (Signed)
Pt was upset that she was not being discharged today. She has not been physically or verbally aggressive though. Torrie Mayers RN

## 2019-10-31 NOTE — Plan of Care (Signed)
New admission.  Problem: Education: Goal: Knowledge of [place] [facility] Education information/materials will improve Outcome: Not Progressing Goal: Mental status will improve Outcome: Not Progressing   Problem: Activity: Goal: Interest or engagement in activities will improve Outcome: Not Progressing   Problem: Safety: Goal: Periods of time without injury will increase Outcome: Not Progressing   Problem: Education: Goal: Will be free of psychotic symptoms Outcome: Not Progressing   Problem: Coping: Goal: Coping ability will improve Outcome: Not Progressing   Problem: Health Behavior/Discharge Planning: Goal: Compliance with prescribed medication regimen will improve Outcome: Not Progressing   Problem: Coping: Goal: Coping ability will improve Outcome: Not Progressing   Problem: Self-Concept: Goal: Level of anxiety will decrease Outcome: Not Progressing

## 2019-10-31 NOTE — ED Notes (Signed)
Pt. Accepted by this nurse to room #23A from ED2.  Pt. Calm and cooperative, waiting to talk to TTS and Psych. Team.

## 2019-10-31 NOTE — Progress Notes (Signed)
Pt has been calm, appropriate, pleasant and resting in her room. Torrie Mayers RN

## 2019-10-31 NOTE — Tx Team (Signed)
Initial Treatment Plan 10/31/2019 11:22 AM Casey Underwood [id number]    PATIENT STRESSORS: Financial difficulties Marital or family conflict Substance abuse   PATIENT STRENGTHS: Wellsite geologist fund of knowledge Physical Health Supportive family/friends   PATIENT IDENTIFIED PROBLEMS: Hallucinations (AVH)  Depression  Anxiety  Substance abuse               DISCHARGE CRITERIA:  Ability to meet basic life and health needs Improved stabilization in mood, thinking, and/or behavior Need for constant or close observation no longer present Reduction of life-threatening or endangering symptoms to within safe limits  PRELIMINARY DISCHARGE PLAN: Outpatient therapy Return to previous living arrangement  PATIENT/FAMILY INVOLVEMENT: This treatment plan has been presented to and reviewed with the patient, Casey Underwood. The patient has been given the opportunity to ask questions and make suggestions.  Aynslee Mulhall, RN 10/31/2019, 11:22 AM

## 2019-10-31 NOTE — [facility] Group Notes (Signed)
LCSW Group Therapy Note  10/31/2019 1:00 PM  Type of Therapy/Topic:  Group Therapy:  Balance in Life  Participation Level:  Did Not Attend  Description of Group:    This group will address the concept of balance and how it feels and looks when one is unbalanced. Patients will be encouraged to process areas in their lives that are out of balance and identify reasons for remaining unbalanced. Facilitators will guide patients in utilizing problem-solving interventions to address and correct the stressor making their life unbalanced. Understanding and applying boundaries will be explored and addressed for obtaining and maintaining a balanced life. Patients will be encouraged to explore ways to assertively make their unbalanced needs known to significant others in their lives, using other group members and facilitator for support and feedback.  Therapeutic Goals: 1. Patient will identify two or more emotions or situations they have that consume much of in their lives. 2. Patient will identify signs/triggers that life has become out of balance:  3. Patient will identify two ways to set boundaries in order to achieve balance in their lives:  4. Patient will demonstrate ability to communicate their needs through discussion and/or role plays  Summary of Patient Progress: X  Therapeutic Modalities:   Cognitive Behavioral Therapy Solution-Focused Therapy Assertiveness Training  Penni Homans MSW, LCSW 10/31/2019 12:49 PM

## 2019-10-31 NOTE — ED Provider Notes (Signed)
Pending review for possible placement with [facility].  

## 2019-10-31 NOTE — Consult Note (Signed)
[facility]   Reason for Consult: Ingestion Referring Physician: Dr. Roxan Hockeyobinson Patient Identification: Casey PoetChasity K Cullifer MRN:  [id number] Principal Diagnosis: <principal problem not specified> Diagnosis:  Active Problems:   BV (bacterial vaginosis)   Pyelonephritis   Bipolar 1 disorder (HCC)   Total Time spent with patient: 45 minutes  Subjective:   Casey K Metta ClinesCrisp is a 24 y.o. female patient [facility] under involuntary commitment status (IVC). The patient was belligerent, agitated, and crying after reporting ingestion of some liquids upon arrival. Per the EMS report, there is a question that she had possibly ingested "edible THC" they also have with them a bottle of cocoa Vaseline lotion and a cleaning solution that is half empty. The patient states that she usually drinks alcohol heavily and that her last drink was today. No previous mental health issues were reported. No current or previous outpatient provider was reported. No previous inpatient hospitalizations were reported.  She voiced a history of suicide attempts. She also notes a history of drug use. She states that she actively abuse crack cocaine for approximately eight months and reports quitting last October 2020. Per her report, she began to use drugs again this February and reports her last use about five days ago. She has a six-year-old son who is in the custody of her grandmother. She is currently unemployed.  The patient was seen face-to-face by this provider; chart reviewed and consulted with Dr. Fuller PlanFunke on 10/31/2019 due to the patient's care. It was discussed with the EDP that the patient does meet the criteria to be admitted to the psychiatric inpatient unit.  The patient is alert and oriented x 3, calm, cooperative, and mood-congruent with affect on evaluation.  The patient does not appear to be responding to internal or external stimuli. The patient is presenting with some delusional  thinking. The patient admits to auditory and visual hallucinations. The patient voiced that these hallucinations present themselves in the form of "footsteps and other sounds". The patient states, "yes, I hear footsteps, but I ignore them so that I can function, and these people think I'm normal." She voiced she has been experiencing visual hallucinations since the age of 41nine or ten years old. Patient reports seeing a little girl with long black hair. She shares that this person will give her signs of things that are to come.  The patient denies suicidal, homicidal, or self-harm ideations. The patient is presenting with some psychotic and paranoid behaviors. During an encounter with the patient, she was able to answer some questions appropriately.  Plan: The patient is a safety risk to herself, others and does require psychiatric inpatient admission for stabilization and treatment.Marland Kitchen.  HPI: Per Dr. Roxan Hockeyobinson: Casey Underwood is a 24 y.o. female with no significant past medical history presents to the ER belligerent agitated crying after reported ingestion.  Patient very poor historian.  She is yelling at the phone stating that she does not want to die but telling her family members on cell phone that she is dying.  In between outbursts states that she was poisoned.  Per EMS report there is question that she had possibly ingested "edible THC" they also have with them a bottle of cocoa Vaseline lotion and a cleaning solution that is half empty.  Solution in the container is reportedly not the same as the labeled solution.  Patient states that she ingested this last night.  Did consent to talking to family members.  Spoke with patient's  friend who was with her last night states that they were drinking vodka and tequila that he last saw her earlier this morning and she was acting at her baseline.  Past Psychiatric History:   Risk to Self:  Yes Risk to Others:  Yes Prior Inpatient Therapy:  Yes Prior Outpatient  Therapy:  Yes  Past Medical History:  Past Medical History:  Diagnosis Date  . Herpes simplex   . Pelvic inflammatory disease     Past Surgical History:  Procedure Laterality Date  . VAGINAL DELIVERY     Family History: No family history on file. Family Psychiatric  History: Maternal-bipolar and schizophrenia Social History:  Social History   Substance and Sexual Activity  Alcohol Use Yes     Social History   Substance and Sexual Activity  Drug Use Never    Social History   Socioeconomic History  . Marital status: Single    Spouse name: Not on file  . Number of children: Not on file  . Years of education: Not on file  . Highest education level: Not on file  Occupational History  . Not on file  Tobacco Use  . Smoking status: Current Some Day Smoker    Types: Cigarettes  . Smokeless tobacco: Never Used  Substance and Sexual Activity  . Alcohol use: Yes  . Drug use: Never  . Sexual activity: Not on file  Other Topics Concern  . Not on file  Social History Narrative  . Not on file   Social Determinants of Health   Financial Resource Strain:   . Difficulty of Paying Living Expenses:   Food Insecurity:   . Worried About Programme researcher, broadcasting/film/video in the Last Year:   . Barista in the Last Year:   Transportation Needs:   . Freight forwarder (Medical):   Marland Kitchen Lack of Transportation (Non-Medical):   Physical Activity:   . Days of Exercise per Week:   . Minutes of Exercise per Session:   Stress:   . Feeling of Stress :   Social Connections:   . Frequency of Communication with Friends and Family:   . Frequency of Social Gatherings with Friends and Family:   . Attends Religious Services:   . Active Member of Clubs or Organizations:   . Attends Banker Meetings:   Marland Kitchen Marital Status:    Additional Social History:    Allergies:  No Known Allergies  Labs:  Results for orders placed or performed during the hospital encounter of 10/30/19 (from  the past 48 hour(s))  CBC with Differential/Platelet     Status: Abnormal   Collection Time: 10/30/19  5:06 PM  Result Value Ref Range   WBC 11.3 (H) 4.0 - 10.5 K/uL   RBC 5.13 (H) 3.87 - 5.11 MIL/uL   Hemoglobin 15.5 (H) 12.0 - 15.0 g/dL   HCT 70.1 77.9 - 39.0 %   MCV 89.5 80.0 - 100.0 fL   MCH 30.2 26.0 - 34.0 pg   MCHC 33.8 30.0 - 36.0 g/dL   RDW 30.0 92.3 - 30.0 %   Platelets 221 150 - 400 K/uL   nRBC 0.0 0.0 - 0.2 %   Neutrophils Relative % 40 %   Neutro Abs 4.5 1.7 - 7.7 K/uL   Lymphocytes Relative 46 %   Lymphs Abs 5.3 (H) 0.7 - 4.0 K/uL   Monocytes Relative 8 %   Monocytes Absolute 0.9 0.1 - 1.0 K/uL   Eosinophils Relative 5 %  Eosinophils Absolute 0.6 (H) 0.0 - 0.5 K/uL   Basophils Relative 0 %   Basophils Absolute 0.0 0.0 - 0.1 K/uL   Immature Granulocytes 1 %   Abs Immature Granulocytes 0.06 0.00 - 0.07 K/uL    Comment: Performed at [facility], [address] [place], [place] [address]  Comprehensive metabolic panel     Status: Abnormal   Collection Time: 10/30/19  5:06 PM  Result Value Ref Range   Sodium 138 135 - 145 mmol/L   Potassium 4.4 3.5 - 5.1 mmol/L   Chloride 104 98 - 111 mmol/L   CO2 26 22 - 32 mmol/L   Glucose, Bld 107 (H) 70 - 99 mg/dL    Comment: Glucose reference range applies only to samples taken after fasting for at least 8 hours.   BUN 19 6 - 20 mg/dL   Creatinine, Ser 6.04 (H) 0.44 - 1.00 mg/dL   Calcium 9.4 8.9 - 54.0 mg/dL   Total Protein 8.2 (H) 6.5 - 8.1 g/dL   Albumin 4.4 3.5 - 5.0 g/dL   AST 16 15 - 41 U/L   ALT 10 0 - 44 U/L   Alkaline Phosphatase 89 38 - 126 U/L   Total Bilirubin 0.4 0.3 - 1.2 mg/dL   GFR calc non Af Amer >60 >60 mL/min   GFR calc Af Amer >60 >60 mL/min   Anion gap 8 5 - 15    Comment: Performed at [facility], [address], [place], [place] [address]  Urine Drug Screen, Qualitative ([facility] only)     Status: Abnormal   Collection Time: 10/30/19  7:06 PM  Result Value Ref Range    Tricyclic, Ur Screen NONE DETECTED NONE DETECTED   Amphetamines, Ur Screen NONE DETECTED NONE DETECTED   MDMA (Ecstasy)Ur Screen NONE DETECTED NONE DETECTED   Cocaine Metabolite,Ur [place] POSITIVE (A) NONE DETECTED   Opiate, Ur Screen NONE DETECTED NONE DETECTED   Phencyclidine (PCP) Ur S NONE DETECTED NONE DETECTED   Cannabinoid 50 Ng, Ur [place] POSITIVE (A) NONE DETECTED   Barbiturates, Ur Screen NONE DETECTED NONE DETECTED   Benzodiazepine, Ur Scrn NONE DETECTED NONE DETECTED   Methadone Scn, Ur NONE DETECTED NONE DETECTED    Comment: (NOTE) Tricyclics + metabolites, urine    Cutoff 1000 ng/mL Amphetamines + metabolites, urine  Cutoff 1000 ng/mL MDMA (Ecstasy), urine              Cutoff 500 ng/mL Cocaine Metabolite, urine          Cutoff 300 ng/mL Opiate + metabolites, urine        Cutoff 300 ng/mL Phencyclidine (PCP), urine         Cutoff 25 ng/mL Cannabinoid, urine                 Cutoff 50 ng/mL Barbiturates + metabolites, urine  Cutoff 200 ng/mL Benzodiazepine, urine              Cutoff 200 ng/mL Methadone, urine                   Cutoff 300 ng/mL The urine drug screen provides only a preliminary, unconfirmed analytical test result and should not be used for non-medical purposes. Clinical consideration and professional judgment should be applied to any positive drug screen result due to possible interfering substances. A more specific alternate chemical method must be used in order to obtain a confirmed analytical result. Gas chromatography / mass spectrometry (GC/MS) is the preferred  confirmat ory method. Performed at [facility], [address] [place], [place] [address]   Urinalysis, Complete w Microscopic     Status: Abnormal   Collection Time: 10/30/19  7:06 PM  Result Value Ref Range   Color, Urine STRAW (A) YELLOW   APPearance CLEAR (A) CLEAR   Specific Gravity, Urine 1.004 (L) 1.005 - 1.030   pH 5.0 5.0 - 8.0   Glucose, UA NEGATIVE NEGATIVE mg/dL   Hgb urine  dipstick NEGATIVE NEGATIVE   Bilirubin Urine NEGATIVE NEGATIVE   Ketones, ur NEGATIVE NEGATIVE mg/dL   Protein, ur NEGATIVE NEGATIVE mg/dL   Nitrite NEGATIVE NEGATIVE   Leukocytes,Ua TRACE (A) NEGATIVE   RBC / HPF 0-5 0 - 5 RBC/hpf   WBC, UA 0-5 0 - 5 WBC/hpf   Bacteria, UA NONE SEEN NONE SEEN   Squamous Epithelial / LPF 0-5 0 - 5    Comment: Performed at [facility], [address] [place], [place] [address]  Ethanol     Status: None   Collection Time: 10/30/19  7:06 PM  Result Value Ref Range   Alcohol, Ethyl (B) <10 <10 mg/dL    Comment: (NOTE) Lowest detectable limit for serum alcohol is 10 mg/dL. For medical purposes only. Performed at [facility], [address] [place], [place] [address]   Acetaminophen level     Status: Abnormal   Collection Time: 10/30/19  7:06 PM  Result Value Ref Range   Acetaminophen (Tylenol), Serum <10 (L) 10 - 30 ug/mL    Comment: (NOTE) Therapeutic concentrations vary significantly. A range of 10-30 ug/mL  may be an effective concentration for many patients. However, some  are best treated at concentrations outside of this range. Acetaminophen concentrations >150 ug/mL at 4 hours after ingestion  and >50 ug/mL at 12 hours after ingestion are often associated with  toxic reactions. Performed at [facility], [address] [place], [place] [address]   Salicylate level     Status: Abnormal   Collection Time: 10/30/19  7:06 PM  Result Value Ref Range   Salicylate Lvl <7.0 (L) 7.0 - 30.0 mg/dL    Comment: Performed at [facility], [address] [place], [place] [address]  Pregnancy, urine POC     Status: None   Collection Time: 10/30/19  7:37 PM  Result Value Ref Range   Preg Test, Ur NEGATIVE NEGATIVE    Comment:        THE SENSITIVITY OF THIS METHODOLOGY IS >24 mIU/mL     No current facility-administered medications for this encounter.   Current Outpatient Medications  Medication Sig  Dispense Refill  . naproxen (NAPROSYN) 500 MG tablet Take 1 tablet (500 mg total) by mouth 2 (two) times daily with a meal. 30 tablet 0  . traMADol (ULTRAM) 50 MG tablet Take 1 tablet (50 mg total) by mouth every 6 (six) hours as needed. 12 tablet 0    Musculoskeletal: Strength & Muscle Tone: within normal limits Gait & Station: normal Patient leans: N/A  Psychiatric Specialty Exam: Physical Exam  Nursing note and vitals reviewed. Constitutional: She is oriented to person, place, and time. She appears well-developed.  Cardiovascular: Normal rate.  Respiratory: Effort normal.  Musculoskeletal:        General: Normal range of motion.     Cervical back: Normal range of motion and neck supple.  Neurological: She is alert and oriented to person, place, and time.    Review of Systems  All  other systems reviewed and are negative.   Blood pressure 125/78, pulse 83, temperature 98.2 F (36.8 C), temperature source Oral, resp. rate 19, weight 54.4 kg, SpO2 100 %.Body mass index is 25.97 kg/m.  General Appearance: Bizarre  Eye Contact:  Good  Speech:  Garbled  Volume:  Normal  Mood:  Euphoric  Affect:  Congruent, Inappropriate and Full Range  Thought Process:  Disorganized, Irrelevant and Descriptions of Associations: Tangential  Orientation:  Full (Time, Place, and Person)  Thought Content:  Delusions and Hallucinations: Auditory Visual  Suicidal Thoughts:  No  Homicidal Thoughts:  No  Memory:  Immediate;   Fair Recent;   Fair Remote;   Fair  Judgement:  Impaired  Insight:  Lacking  Psychomotor Activity:  Normal  Concentration:  Concentration: Fair and Attention Span: Fair  Recall:  Good  Fund of Knowledge:  Fair  Language:  Fair  Akathisia:  Negative  Handed:  Right  AIMS (if indicated):     Assets:  Desire for Improvement Financial Resources/Insurance Housing Leisure Time Resilience Social Support  ADL's:  Intact  Cognition:  WNL  Sleep:    Good     Treatment  Plan Summary: Medication management and Plan Patient meets criteria for psychiatric inpatient admission.  Disposition: Recommend psychiatric Inpatient admission when medically cleared. Supportive therapy provided about ongoing stressors.  Caroline Sauger, NP 10/31/2019 3:53 AM

## 2019-10-31 NOTE — ED Notes (Signed)
Pt in dry scrubs now.  Pt sitting in chair eating dinner tray.  Pt calm and cooperative.

## 2019-10-31 NOTE — Progress Notes (Signed)
Pt was in her room upon arrival to the unit. Patient pleasant during assessment denying SI/HI/AVH and pain. Patient says she has some anxiety and depression because of "everything" going on In her life. When assessed patient stated she didn't want to get into it all right now. Patient was observed by this Clinical research associate interacting appropriately with staff and peers on the unit. Patient being monitored Q 15 minutes for safety per unit protocol. Patient remains safe on the unit.

## 2019-10-31 NOTE — ED Notes (Signed)
Pt sleeping  Urinary incontinence noted.

## 2019-10-31 NOTE — ED Notes (Signed)
Pt waiting on tts to talk with pt  Pt alert   This rn spoke with poision control again and gave an update to kevin with poison control.

## 2019-10-31 NOTE — ED Notes (Signed)
Pt ate dinner tray and crackers and peanut butter with cola.  Pt calm and cooperative.
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
## 2019-10-31 NOTE — ED Notes (Signed)
Pt given meal tray.

## 2019-11-01 NOTE — Progress Notes (Signed)
D- Patient alert and oriented. Patient presents in a pleasant mood on assessment and had no complaints to voice to this Clinical research associate. Patient denies SI, HI, AVH, and pain at this time. Patient also denies any signs/symptoms of depression/anxiety. Patient had no stated goals for today, however, she stated that she was ready to go.  A- Support and encouragement provided. Routine safety checks conducted every 15 minutes. Patient informed to notify staff with problems or concerns.  R- Patient contracts for safety at this time. Patient compliant with treatment plan. Patient receptive, calm, and cooperative. Patient interacts well with others on the unit.  Patient remains safe at this time.

## 2019-11-01 NOTE — Progress Notes (Signed)
Patient ID: Casey Underwood, female   DOB: 01/09/1996, 24 y.o.   MRN: [id number]   Discharge Note:  Patient denies SI/HI/AVH at this time. Discharge instructions, AVS and transition record gone over with patient. Patient declined follow-up. Patient belongings returned to patient. Patient questions and concerns addressed and answered. Patient ambulatory off unit. Patient discharged to home with her Dad.

## 2019-11-01 NOTE — Progress Notes (Signed)
Recreation Therapy Notes  INPATIENT RECREATION TR PLAN  Patient Details Name: Casey Underwood MRN: [id number] DOB: 20-Jun-1996 Today's Date: 11/01/2019  Rec Therapy Plan Is patient appropriate for Therapeutic Recreation?: Yes Treatment times per week: at least 3 Estimated Length of Stay: 5-7 days TR Treatment/Interventions: Group participation (Comment)  Discharge Criteria Pt will be discharged from therapy if:: Discharged Treatment plan/goals/alternatives discussed and agreed upon by:: Patient/family  Discharge Summary Short term goals set: Patient will engage in groups without prompting or encouragement from LRT x3 group sessions within 5 recreation therapy group sessions Short term goals met: Adequate for discharge Progress toward goals comments: Groups attended Which groups?: Other (Comment)(Happiness) Reason goals not met: N/A Therapeutic equipment acquired: N/A Reason patient discharged from therapy: Discharge from hospital Pt/family agrees with progress & goals achieved: Yes Date patient discharged from therapy: 11/01/19   Shantoya Geurts 11/01/2019, 11:27 AM

## 2019-11-01 NOTE — Progress Notes (Signed)
  [facility] Adult Case Management Discharge Plan :  Will you be returning to the same living situation after discharge:  Yes,  lives with mother At discharge, do you have transportation home?: Yes,  pt reports father will pick up Do you have the ability to pay for your medications: Yes,  [employer]  Release of information consent forms completed and in the chart;  Patient's signature needed at discharge.  Patient to Follow up at: Follow-up Information    Patient declined follow-up Follow up.   Why: Patient provided with list of community mental health provider should she change her mind.          Next level of care provider has access to [facility] Link:no  Safety Planning and Suicide Prevention discussed: Yes,  with pt; declined family contact  Have you used any form of tobacco in the last 30 days? (Cigarettes, Smokeless Tobacco, Cigars, and/or Pipes): Yes  Has patient been referred to the Quitline?: Patient refused referral  Patient has been referred for addiction treatment: N/A  Suzan Slick, LCSW 11/01/2019, 9:43 AM
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
## 2019-11-01 NOTE — [facility] Suicide Risk Assessment (Signed)
[facility] Discharge Suicide Risk Assessment   Principal Problem: Psychoactive substance-induced psychosis ([facility]) Discharge Diagnoses: Principal Problem:   Psychoactive substance-induced psychosis ([facility]) Active Problems:   Bipolar 1 disorder ([facility])   Alcohol abuse   Cocaine abuse ([facility])   Cannabis abuse   Total Time spent with patient: 30 minutes  Musculoskeletal: Strength & Muscle Tone: within normal limits Gait & Station: normal Patient leans: N/A  Psychiatric Specialty Exam: Review of Systems  Constitutional: Negative.   HENT: Negative.   Eyes: Negative.   Respiratory: Negative.   Cardiovascular: Negative.   Gastrointestinal: Negative.   Musculoskeletal: Negative.   Skin: Negative.   Neurological: Negative.   Psychiatric/Behavioral: Positive for dysphoric mood and hallucinations. Negative for behavioral problems, confusion and decreased concentration. The patient is nervous/anxious. The patient is not hyperactive.     Blood pressure (!) 87/62, pulse 77, temperature 98.1 F (36.7 C), resp. rate 16, height 5\' 9"  (1.753 m), weight 54.4 kg, SpO2 100 %.Body mass index is 17.72 kg/m.  General Appearance: Casual  Eye Contact::  Good  Speech:  Clear and Coherent409  Volume:  Normal  Mood:  Euthymic  Affect:  Congruent  Thought Process:  Goal Directed  Orientation:  Full (Time, Place, and Person)  Thought Content:  Logical  Suicidal Thoughts:  No  Homicidal Thoughts:  No  Memory:  Immediate;   Fair Recent;   Fair Remote;   Fair  Judgement:  Fair  Insight:  Shallow  Psychomotor Activity:  Normal  Concentration:  Fair  Recall:  [phone] of Knowledge:Fair  Language: Fair  Akathisia:  No  Handed:  Right  AIMS (if indicated):     Assets:  Desire for Improvement Housing Physical Health Resilience  Sleep:  Number of Hours: 7.5  Cognition: WNL  ADL's:  Intact   Mental Status Per Nursing Assessment::   On Admission:  NA  Demographic Factors:  Adolescent or young adult  and Unemployed  Loss Factors: Financial problems/change in socioeconomic status  Historical Factors: NA  Risk Reduction Factors:   Living with another person, especially a relative  Continued Clinical Symptoms:  Depression:   Impulsivity Alcohol/Substance Abuse/Dependencies  Cognitive Features That Contribute To Risk:  None    Suicide Risk:  Minimal: No identifiable suicidal ideation.  Patients presenting with no risk factors but with morbid ruminations; may be classified as minimal risk based on the severity of the depressive symptoms  Follow-up Information    Patient declined follow-up Follow up.   Why: Patient provided with list of community mental health provider should she change her mind.          Plan Of Care/Follow-up recommendations:  Activity:  as tolerated Diet:  regular Other:  encourage outpatient treatment  [phone], MD 11/01/2019, 11:09 AM

## 2019-11-01 NOTE — Plan of Care (Signed)
  Problem: Group Participation Goal: STG - Patient will engage in groups without prompting or encouragement from LRT x3 group sessions within 5 recreation therapy group sessions Description: STG - Patient will engage in groups without prompting or encouragement from LRT x3 group sessions within 5 recreation therapy group sessions 11/01/2019 1123 by Alveria Apley, LRT Outcome: Adequate for Discharge 11/01/2019 1123 by Alveria Apley, LRT Outcome: Adequate for Discharge
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
## 2019-11-01 NOTE — BHH Suicide Risk Assessment (Signed)
B[facility] INPATIENT:  Family/Significant Other Suicide Prevention Education  Suicide Prevention Education:  Patient Refusal for Family/Significant Other Suicide Prevention Education: The patient Casey Underwood has refused to provide written consent for family/significant other to be provided Family/Significant Other Suicide Prevention Education during admission and/or prior to discharge.  Physician notified.  Tyric Rodeheaver T Boby Eyer 11/01/2019, 9:42 AM

## 2019-11-01 NOTE — Progress Notes (Signed)
Recreation Therapy Notes  Date: 11/01/2019  Time: 9:30 am  Location: Craft Room  Behavioral response: Appropriate   Intervention Topic: Happiness    Discussion/Intervention:  Group content today was focused on Happiness. The group defined happiness and described where happiness comes from. Individuals identified what makes them happy and how they go about making others happy. Patients expressed things that stop them from being happy and ways they can improve their happiness. The group stated reasons why it is important to be happy. The group participated in the intervention "My Happiness", where they had a chance to identify and express things that make them happy. Clinical Observations/Feedback:  Patient came to group and define happiness as positive energy. She expressed that being discharged today makes her happy as well as dancing, drawing and family. Participant explained expecting you from other can make you unhappy. She stated that happiness is important to make life easier. Individual was social with peers and staff while participant in the intervention during group.  Casey Underwood LRT/CTRS         Marae Cottrell 11/01/2019 11:03 AM

## 2019-11-01 NOTE — BHH Counselor (Signed)
CSW team provided the patient with list of local community mental health resources.    Pt declined aftercare follow up.  Penni Homans, MSW, LCSW 11/01/2019 9:29 AM

## 2019-11-21 ENCOUNTER — Other Ambulatory Visit: Payer: Self-pay

## 2019-11-21 ENCOUNTER — Emergency Department
Admission: EM | Admit: 2019-11-21 | Discharge: 2019-11-21 | Disposition: A | Discharge status: Home / Self Care | Payer: 59 | Attending: Emergency Medicine | Admitting: Emergency Medicine

## 2019-11-21 DIAGNOSIS — R400 Somnolence: Secondary | ICD-10-CM

## 2019-11-21 DIAGNOSIS — T50904A Poisoning by unspecified drugs, medicaments and biological substances, undetermined, initial encounter: Secondary | ICD-10-CM

## 2019-11-21 DIAGNOSIS — T50901A Poisoning by unspecified drugs, medicaments and biological substances, accidental (unintentional), initial encounter: Secondary | ICD-10-CM | POA: Insufficient documentation

## 2019-11-21 DIAGNOSIS — F17210 Nicotine dependence, cigarettes, uncomplicated: Secondary | ICD-10-CM | POA: Insufficient documentation

## 2019-11-21 LAB — URINALYSIS, COMPLETE (UACMP) WITH MICROSCOPIC
Bacteria, UA: NONE SEEN
Bilirubin Urine: NEGATIVE
Glucose, UA: NEGATIVE mg/dL
Hgb urine dipstick: NEGATIVE
Ketones, ur: 80 mg/dL — AB
Leukocytes,Ua: NEGATIVE
Nitrite: NEGATIVE
Protein, ur: NEGATIVE mg/dL
Specific Gravity, Urine: 1.028 (ref 1.005–1.030)
pH: 5 (ref 5.0–8.0)

## 2019-11-21 LAB — URINE DRUG SCREEN, QUALITATIVE (ARMC ONLY)
Amphetamines, Ur Screen: POSITIVE — AB
Barbiturates, Ur Screen: NOT DETECTED
Benzodiazepine, Ur Scrn: NOT DETECTED
Cannabinoid 50 Ng, Ur ~~LOC~~: NOT DETECTED
Cocaine Metabolite,Ur ~~LOC~~: POSITIVE — AB
MDMA (Ecstasy)Ur Screen: NOT DETECTED
Methadone Scn, Ur: NOT DETECTED
Opiate, Ur Screen: NOT DETECTED
Phencyclidine (PCP) Ur S: NOT DETECTED
Tricyclic, Ur Screen: NOT DETECTED

## 2019-11-21 LAB — CBC
HCT: 45.7 % (ref 36.0–46.0)
Hemoglobin: 15.8 g/dL — ABNORMAL HIGH (ref 12.0–15.0)
MCH: 30.8 pg (ref 26.0–34.0)
MCHC: 34.6 g/dL (ref 30.0–36.0)
MCV: 89.1 fL (ref 80.0–100.0)
Platelets: 231 10*3/uL (ref 150–400)
RBC: 5.13 MIL/uL — ABNORMAL HIGH (ref 3.87–5.11)
RDW: 12.6 % (ref 11.5–15.5)
WBC: 7.4 10*3/uL (ref 4.0–10.5)
nRBC: 0 % (ref 0.0–0.2)

## 2019-11-21 LAB — BASIC METABOLIC PANEL
Anion gap: 9 (ref 5–15)
BUN: 11 mg/dL (ref 6–20)
CO2: 24 mmol/L (ref 22–32)
Calcium: 8.9 mg/dL (ref 8.9–10.3)
Chloride: 106 mmol/L (ref 98–111)
Creatinine, Ser: 0.92 mg/dL (ref 0.44–1.00)
GFR calc Af Amer: >60 mL/min (ref >60)
GFR calc non Af Amer: >60 mL/min (ref >60)
Glucose, Bld: 115 mg/dL — ABNORMAL HIGH (ref 70–99)
Potassium: 3.4 mmol/L — ABNORMAL LOW (ref 3.5–5.1)
Sodium: 139 mmol/L (ref 135–145)

## 2019-11-21 LAB — HCG, QUANTITATIVE, PREGNANCY: hCG, Beta Chain, Quant, S: <1 m[IU]/mL (ref <5)

## 2019-11-21 LAB — PREGNANCY, URINE: Preg Test, Ur: NEGATIVE

## 2019-11-21 MED ORDER — SODIUM CHLORIDE 0.9% FLUSH: 3.0000 mL | Freq: Once | INTRAVENOUS | Status: DC

## 2019-11-21 MED ORDER — NALOXONE HCL 4 MG/0.1ML NA LIQD
1.0000 | Freq: Once | NASAL | Status: AC
  Administered 2019-11-21: 1 via NASAL
  Filled 2019-11-21: qty 4

## 2019-11-21 NOTE — ED Provider Notes (Signed)
Patient denies any complaints at this time and but states she feels better.  She is ambulatory and states she wants to eat and leave.  She has apparently cleared for outpatient follow-up.   Emily Filbert, MD 11/21/19 [phone]
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
## 2019-11-21 NOTE — ED Notes (Signed)
Pt very lethargic but arises to voice. Medication given with no change. Pt placed on cardiac monitor and EDP made aware.
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
## 2020-10-08 ENCOUNTER — Ambulatory Visit: Payer: Self-pay

## 2022-01-07 ENCOUNTER — Other Ambulatory Visit: Payer: Self-pay

## 2022-01-07 ENCOUNTER — Emergency Department
Admission: EM | Admit: 2022-01-07 | Discharge: 2022-01-07 | Disposition: A | Discharge status: Home / Self Care | Payer: Self-pay | Attending: Emergency Medicine | Admitting: Emergency Medicine

## 2022-01-07 DIAGNOSIS — S0512XA Contusion of eyeball and orbital tissues, left eye, initial encounter: Secondary | ICD-10-CM | POA: Insufficient documentation

## 2022-01-07 DIAGNOSIS — H5712 Ocular pain, left eye: Secondary | ICD-10-CM

## 2022-01-07 MED ORDER — FLUORESCEIN SODIUM 1 MG OP STRP
1.0000 | ORAL_STRIP | Freq: Once | OPHTHALMIC | Status: DC
  Filled 2022-01-07: qty 1

## 2022-01-07 MED ORDER — TETRACAINE HCL 0.5 % OP SOLN
2.0000 [drp] | Freq: Once | OPHTHALMIC | Status: DC
  Filled 2022-01-07: qty 4

## 2022-01-07 MED ORDER — ERYTHROMYCIN 5 MG/GM OP OINT: 1.0000 "application " | TOPICAL_OINTMENT | Freq: Four times a day (QID) | OPHTHALMIC | 0 refills | Status: AC
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
## 2022-01-07 NOTE — ED Notes (Signed)
Pt returned back to ED lobby- still has bracelet on.
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
## 2022-01-07 NOTE — ED Notes (Signed)
Pt not waking up for eye exam, vitals WNL. EDP notified

## 2022-01-07 NOTE — Discharge Instructions (Signed)
Your exam is normal and reassuring.  No sign of a corneal abrasion or serious injury to your eye.  Use the eye ointment as needed.  [facility].

## 2022-01-07 NOTE — ED Provider Triage Note (Signed)
Emergency Medicine Provider Triage Evaluation Note  Casey Underwood , a 26 y.o. female  was evaluated in triage.  Pt complains of left eye pain  Review of Systems  Positive: Sensitivity to light   Physical Exam  BP 115/81   Pulse (!) 101   Temp 98.1 F (36.7 C) (Oral)   Resp 18   SpO2 99%  Gen:   Awake, no distress   Resp:  Normal effort  MSK:   Moves extremities without difficulty  Other:    Medical Decision Making  Medically screening exam initiated at 12:50 PM.  Appropriate orders placed.  Casey Underwood was informed that the remainder of the evaluation will be completed by another provider, this initial triage assessment does not replace that evaluation, and the importance of remaining in the ED until their evaluation is complete.    Lavonia Drafts, MD 01/07/22 1251

## 2022-10-17 ENCOUNTER — Encounter: Payer: Self-pay | Admitting: Emergency Medicine

## 2022-10-17 ENCOUNTER — Emergency Department
Admission: EM | Admit: 2022-10-17 | Discharge: 2022-10-19 | Disposition: A | Discharge status: Home / Self Care | Payer: Self-pay | Attending: Emergency Medicine | Admitting: Emergency Medicine

## 2022-10-17 DIAGNOSIS — F1910 Other psychoactive substance abuse, uncomplicated: Secondary | ICD-10-CM

## 2022-10-17 DIAGNOSIS — F1010 Alcohol abuse, uncomplicated: Secondary | ICD-10-CM | POA: Diagnosis present

## 2022-10-17 DIAGNOSIS — F17210 Nicotine dependence, cigarettes, uncomplicated: Secondary | ICD-10-CM | POA: Insufficient documentation

## 2022-10-17 DIAGNOSIS — F19951 Other psychoactive substance use, unspecified with psychoactive substance-induced psychotic disorder with hallucinations: Secondary | ICD-10-CM | POA: Insufficient documentation

## 2022-10-17 DIAGNOSIS — R462 Strange and inexplicable behavior: Secondary | ICD-10-CM

## 2022-10-17 DIAGNOSIS — F319 Bipolar disorder, unspecified: Secondary | ICD-10-CM | POA: Diagnosis present

## 2022-10-17 DIAGNOSIS — F1410 Cocaine abuse, uncomplicated: Secondary | ICD-10-CM | POA: Diagnosis present

## 2022-10-17 DIAGNOSIS — F19959 Other psychoactive substance use, unspecified with psychoactive substance-induced psychotic disorder, unspecified: Secondary | ICD-10-CM | POA: Diagnosis present

## 2022-10-17 LAB — CBC WITH DIFFERENTIAL/PLATELET
Abs Immature Granulocytes: 0.02 10*3/uL (ref 0.00–0.07)
Basophils Absolute: 0 10*3/uL (ref 0.0–0.1)
Basophils Relative: 0 %
Eosinophils Absolute: 0.1 10*3/uL (ref 0.0–0.5)
Eosinophils Relative: 1 %
HCT: 38.8 % (ref 36.0–46.0)
Hemoglobin: 11.6 g/dL — ABNORMAL LOW (ref 12.0–15.0)
Immature Granulocytes: 0 %
Lymphocytes Relative: 19 %
Lymphs Abs: 1.4 10*3/uL (ref 0.7–4.0)
MCH: 25.2 pg — ABNORMAL LOW (ref 26.0–34.0)
MCHC: 29.9 g/dL — ABNORMAL LOW (ref 30.0–36.0)
MCV: 84.2 fL (ref 80.0–100.0)
Monocytes Absolute: 0.4 10*3/uL (ref 0.1–1.0)
Monocytes Relative: 6 %
Neutro Abs: 5.6 10*3/uL (ref 1.7–7.7)
Neutrophils Relative %: 74 %
Platelets: 262 10*3/uL (ref 150–400)
RBC: 4.61 MIL/uL (ref 3.87–5.11)
RDW: 15.2 % (ref 11.5–15.5)
WBC: 7.5 10*3/uL (ref 4.0–10.5)
nRBC: 0 % (ref 0.0–0.2)

## 2022-10-17 MED ORDER — LORAZEPAM 2 MG/ML IJ SOLN
2.0000 mg | Freq: Once | INTRAMUSCULAR | Status: AC
  Administered 2022-10-17: 2 mg via INTRAMUSCULAR
  Filled 2022-10-17: qty 1

## 2022-10-17 MED ORDER — DROPERIDOL 2.5 MG/ML IJ SOLN
2.5000 mg | Freq: Once | INTRAMUSCULAR | Status: AC
  Administered 2022-10-17: 2.5 mg via INTRAMUSCULAR
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
## 2022-10-18 DIAGNOSIS — F1410 Cocaine abuse, uncomplicated: Secondary | ICD-10-CM

## 2022-10-18 DIAGNOSIS — F1010 Alcohol abuse, uncomplicated: Secondary | ICD-10-CM

## 2022-10-18 DIAGNOSIS — F319 Bipolar disorder, unspecified: Secondary | ICD-10-CM

## 2022-10-18 LAB — SALICYLATE LEVEL: Salicylate Lvl: <7 mg/dL — ABNORMAL LOW (ref 7.0–30.0)

## 2022-10-18 LAB — COMPREHENSIVE METABOLIC PANEL
ALT: 9 U/L (ref 0–44)
AST: 18 U/L (ref 15–41)
Albumin: 3.7 g/dL (ref 3.5–5.0)
Alkaline Phosphatase: 69 U/L (ref 38–126)
Anion gap: 5 (ref 5–15)
BUN: 19 mg/dL (ref 6–20)
CO2: 24 mmol/L (ref 22–32)
Calcium: 8.4 mg/dL — ABNORMAL LOW (ref 8.9–10.3)
Chloride: 112 mmol/L — ABNORMAL HIGH (ref 98–111)
Creatinine, Ser: 1.17 mg/dL — ABNORMAL HIGH (ref 0.44–1.00)
GFR, Estimated: >60 mL/min (ref >60)
Glucose, Bld: 153 mg/dL — ABNORMAL HIGH (ref 70–99)
Potassium: 4.3 mmol/L (ref 3.5–5.1)
Sodium: 141 mmol/L (ref 135–145)
Total Bilirubin: 0.4 mg/dL (ref 0.3–1.2)
Total Protein: 6.8 g/dL (ref 6.5–8.1)

## 2022-10-18 LAB — ACETAMINOPHEN LEVEL: Acetaminophen (Tylenol), Serum: <10 ug/mL — ABNORMAL LOW (ref 10–30)

## 2022-10-18 LAB — ETHANOL: Alcohol, Ethyl (B): <10 mg/dL (ref <10)

## 2022-10-18 NOTE — ED Notes (Signed)
Pt received meal tray and beverage at this time. 

## 2022-10-18 NOTE — ED Notes (Signed)
Pt has remained curled in a ball all morning. When this RN asked questions (how are you, what is your name, would you like water) she only gave a nod when asked if she wanted a blanket. This RN has made frequent rounds throughout shift with pt but pt does not respond. CN and NP Louise aware.

## 2022-10-18 NOTE — ED Notes (Signed)
Pt eating dinner tray °

## 2022-10-18 NOTE — BH Assessment (Signed)
Psych Team made attempt to wake patient. Patient could not be aroused at this time. Will try again later.

## 2022-10-18 NOTE — ED Notes (Signed)
ivc/pending consult.Marland Kitchen

## 2022-10-18 NOTE — Consult Note (Signed)
  0830: The Psych Team attempted to consult on patient. She was sleeping soundly, rise and fall of chest observed. She was medicated 10/17/22 with IM droperidol and IM Ativan for agitation. The Psych Team will re-attempt to assess when patient is awake and able to participate.   Zaley Talley H. Richardson Landry, NP.  10/18/2022 0830

## 2022-10-18 NOTE — ED Notes (Signed)
ivc by MD Wong/psych consult ordered/pending.Casey Underwood
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
## 2022-10-18 NOTE — BH Assessment (Signed)
Patient received agitation medications IM and continues to be asleep, unable to complete her assessment at this time. Psych team to follow up.
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
## 2022-10-19 LAB — URINE DRUG SCREEN, QUALITATIVE (ARMC ONLY)
Amphetamines, Ur Screen: NOT DETECTED
Barbiturates, Ur Screen: NOT DETECTED
Benzodiazepine, Ur Scrn: NOT DETECTED
Cannabinoid 50 Ng, Ur ~~LOC~~: POSITIVE — AB
Cocaine Metabolite,Ur ~~LOC~~: POSITIVE — AB
MDMA (Ecstasy)Ur Screen: NOT DETECTED
Methadone Scn, Ur: NOT DETECTED
Opiate, Ur Screen: NOT DETECTED
Phencyclidine (PCP) Ur S: NOT DETECTED
Tricyclic, Ur Screen: NOT DETECTED

## 2022-10-19 LAB — PREGNANCY, URINE: Preg Test, Ur: NEGATIVE

## 2022-10-19 NOTE — Discharge Instructions (Signed)
Cleared by psychiatry for discharge

## 2022-10-19 NOTE — ED Notes (Signed)
Pt awoken for vital signs, pt with trash in bed, encouraged pt to allow staff to discard trash, pt declines and falls back asleep.

## 2022-10-19 NOTE — ED Notes (Signed)
VOLUNTARY as IVC rescinded by NP Barthold  
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
## 2022-10-19 NOTE — ED Notes (Signed)
Pt given shower supplies and placed in restroom

## 2022-10-19 NOTE — ED Notes (Signed)
Pt awake, drinking water.

## 2022-10-19 NOTE — ED Notes (Signed)
NP at the bedside for pt evaluation   

## 2022-10-19 NOTE — ED Notes (Signed)
Pt given breakfast tray and beverage.  

## 2022-10-19 NOTE — ED Notes (Signed)
Ivc / patient observation to fully metabbolize illicit substances and be reassessed this am

## 2022-10-19 NOTE — ED Provider Notes (Signed)
Emergency Medicine Observation Re-evaluation Note  Physical Exam   BP 99/68 (BP Location: Left Arm)   Pulse 75   Temp 98.4 F (36.9 C) (Oral)   Resp 18   SpO2 100%   Patient appears in no acute distress.  ED Course / MDM   No reported events during my shift at the time of this note.   Pt is awaiting dispo from SW   Lucillie Garfinkel MD    Lucillie Garfinkel, MD 10/19/22 [phone]

## 2022-10-19 NOTE — Consult Note (Signed)
[facility] Consult   Reason for Consult:  Psychiatric Evaluation   REASSESSMENT Referring Physician:  Dr. Jacelyn Grip  Patient Identification: Casey Underwood MRN:  [id number] Principal Diagnosis: Psychoactive substance-induced psychosis ([place]) Diagnosis:  Principal Problem:   Psychoactive substance-induced psychosis ([place]) Active Problems:   Bipolar 1 disorder ([place])   Alcohol abuse   Cocaine abuse ([place])   Total Time spent with patient: 30 minutes  Subjective:  "How did I end up in here".  Casey Underwood is a 27 y.o. female patient admitted with 'bizarre behavior'.  Upon evaluation today, the patient is alert and oriented but does not recall the events leading to hospitalization. She acknowledges substance use, including crack cocaine, THC Gummies, and alcohol prior to the onset of symptoms. Currently, she denies experiencing hallucinations, suicidal or homicidal ideations, and delusional thinking. She reports feeling safe at home, with satisfactory appetite and sleep. Patient educated on the risks of substance use and benefits of treatment.  She declines substance abuse treatment at present.  She is calm and cooperative, engaged during interview, and speaking in clear and coherent sentences.  No evidence of psychosis at this time.   Past Psychiatric History: Bipolar 1 disorder. Alcohol abuse, cocaine abuse, cannabis abuse. Previous psychiatric hospitalization March, 2021 for similar occurrence involving edible THC.   Risk to Self:  No  Risk to Others:  No  Prior Inpatient Therapy: Denies   Prior Outpatient Therapy:  Denies   Past Medical History:  Past Medical History:  Diagnosis Date   Herpes simplex    Pelvic inflammatory disease     Past Surgical History:  Procedure Laterality Date   VAGINAL DELIVERY     Family History: History reviewed. No pertinent family history. Family Psychiatric  History: None reported  Social History:  Social History   Substance and  Sexual Activity  Alcohol Use Yes     Social History   Substance and Sexual Activity  Drug Use Never    Social History   Socioeconomic History   Marital status: Single    Spouse name: Not on file   Number of children: Not on file   Years of education: Not on file   Highest education level: Not on file  Occupational History   Not on file  Tobacco Use   Smoking status: Some Days    Types: Cigarettes   Smokeless tobacco: Never  Vaping Use   Vaping Use: Never used  Substance and Sexual Activity   Alcohol use: Yes   Drug use: Never   Sexual activity: Not on file  Other Topics Concern   Not on file  Social History Narrative   Not on file   Social Determinants of Health   Financial Resource Strain: Not on file  Food Insecurity: Not on file  Transportation Needs: Not on file  Physical Activity: Not on file  Stress: Not on file  Social Connections: Not on file   Additional Social History:    Allergies:  No Known Allergies  Labs:  Results for orders placed or performed during the hospital encounter of 10/17/22 (from the past 48 hour(s))  Acetaminophen level     Status: Abnormal   Collection Time: 10/17/22 11:50 PM  Result Value Ref Range   Acetaminophen (Tylenol), Serum <10 (L) 10 - 30 ug/mL    Comment: (NOTE) Therapeutic concentrations vary significantly. A range of 10-30 ug/mL  may be an effective concentration for many patients. However, some  are best treated at concentrations outside of this  range. Acetaminophen concentrations >150 ug/mL at 4 hours after ingestion  and >50 ug/mL at 12 hours after ingestion are often associated with  toxic reactions.  Performed at [facility], [address] [place], [place] [address]   Comprehensive metabolic panel     Status: Abnormal   Collection Time: 10/17/22 11:50 PM  Result Value Ref Range   Sodium 141 135 - 145 mmol/L   Potassium 4.3 3.5 - 5.1 mmol/L   Chloride 112 (H) 98 - 111 mmol/L   CO2 24 22 - 32  mmol/L   Glucose, Bld 153 (H) 70 - 99 mg/dL    Comment: Glucose reference range applies only to samples taken after fasting for at least 8 hours.   BUN 19 6 - 20 mg/dL   Creatinine, Ser 1.17 (H) 0.44 - 1.00 mg/dL   Calcium 8.4 (L) 8.9 - 10.3 mg/dL   Total Protein 6.8 6.5 - 8.1 g/dL   Albumin 3.7 3.5 - 5.0 g/dL   AST 18 15 - 41 U/L   ALT 9 0 - 44 U/L   Alkaline Phosphatase 69 38 - 126 U/L   Total Bilirubin 0.4 0.3 - 1.2 mg/dL   GFR, Estimated >60 >60 mL/min    Comment: (NOTE) Calculated using the CKD-EPI Creatinine Equation (2021)    Anion gap 5 5 - 15    Comment: Performed at [facility], [address], [place], [place] [address]  Ethanol     Status: None   Collection Time: 10/17/22 11:50 PM  Result Value Ref Range   Alcohol, Ethyl (B) <10 <10 mg/dL    Comment: (NOTE) Lowest detectable limit for serum alcohol is 10 mg/dL.  For medical purposes only. Performed at [facility], [address] [place], [place] [id number]   Salicylate level     Status: Abnormal   Collection Time: 10/17/22 11:50 PM  Result Value Ref Range   Salicylate Lvl Q000111Q (L) 7.0 - 30.0 mg/dL    Comment: Performed at [facility], [address] [place], [place] [address]  CBC with Differential     Status: Abnormal   Collection Time: 10/17/22 11:50 PM  Result Value Ref Range   WBC 7.5 4.0 - 10.5 K/uL   RBC 4.61 3.87 - 5.11 MIL/uL   Hemoglobin 11.6 (L) 12.0 - 15.0 g/dL   HCT 38.8 36.0 - 46.0 %   MCV 84.2 80.0 - 100.0 fL   MCH 25.2 (L) 26.0 - 34.0 pg   MCHC 29.9 (L) 30.0 - 36.0 g/dL   RDW 15.2 11.5 - 15.5 %   Platelets 262 150 - 400 K/uL   nRBC 0.0 0.0 - 0.2 %   Neutrophils Relative % 74 %   Neutro Abs 5.6 1.7 - 7.7 K/uL   Lymphocytes Relative 19 %   Lymphs Abs 1.4 0.7 - 4.0 K/uL   Monocytes Relative 6 %   Monocytes Absolute 0.4 0.1 - 1.0 K/uL   Eosinophils Relative 1 %   Eosinophils Absolute 0.1 0.0 - 0.5 K/uL   Basophils Relative 0 %   Basophils Absolute 0.0 0.0  - 0.1 K/uL   Immature Granulocytes 0 %   Abs Immature Granulocytes 0.02 0.00 - 0.07 K/uL    Comment: Performed at [facility], [address], [place], [place] [address]  Urine Drug Screen, Qualitative     Status: Abnormal   Collection Time: 10/19/22 11:30 AM  Result Value Ref Range   Tricyclic, Ur Screen NONE DETECTED NONE DETECTED  Amphetamines, Ur Screen NONE DETECTED NONE DETECTED   MDMA (Ecstasy)Ur Screen NONE DETECTED NONE DETECTED   Cocaine Metabolite,Ur [place] POSITIVE (A) NONE DETECTED   Opiate, Ur Screen NONE DETECTED NONE DETECTED   Phencyclidine (PCP) Ur S NONE DETECTED NONE DETECTED   Cannabinoid 50 Ng, Ur [place] POSITIVE (A) NONE DETECTED   Barbiturates, Ur Screen NONE DETECTED NONE DETECTED   Benzodiazepine, Ur Scrn NONE DETECTED NONE DETECTED   Methadone Scn, Ur NONE DETECTED NONE DETECTED    Comment: (NOTE) Tricyclics + metabolites, urine    Cutoff 1000 ng/mL Amphetamines + metabolites, urine  Cutoff 1000 ng/mL MDMA (Ecstasy), urine              Cutoff 500 ng/mL Cocaine Metabolite, urine          Cutoff 300 ng/mL Opiate + metabolites, urine        Cutoff 300 ng/mL Phencyclidine (PCP), urine         Cutoff 25 ng/mL Cannabinoid, urine                 Cutoff 50 ng/mL Barbiturates + metabolites, urine  Cutoff 200 ng/mL Benzodiazepine, urine              Cutoff 200 ng/mL Methadone, urine                   Cutoff 300 ng/mL  The urine drug screen provides only a preliminary, unconfirmed analytical test result and should not be used for non-medical purposes. Clinical consideration and professional judgment should be applied to any positive drug screen result due to possible interfering substances. A more specific alternate chemical method must be used in order to obtain a confirmed analytical result. Gas chromatography / mass spectrometry (GC/MS) is the preferred confirm atory method. Performed at [facility], [address] [place], [place] [address]      No current facility-administered medications for this encounter.   Current Outpatient Medications  Medication Sig Dispense Refill   erythromycin ophthalmic ointment Place 1 application. into the left eye 4 (four) times daily. (Patient not taking: Reported on 10/17/2022) 3.5 g 0    Musculoskeletal: Strength & Muscle Tone: within normal limits Gait & Station:  Did not assess  Patient leans: N/A    Psychiatric Specialty Exam: General Appearance: Disheveled  Eye Contact:  Good  Speech:  Clear and Coherent  Volume:  Normal  Mood:   Calm and cooperatve  Affect:  Congruent  Thought Process:  Coherent  Orientation:  Full (Time, Place, and Person)  Thought Content:  WDL  Suicidal Thoughts:  No  Homicidal Thoughts:  No  Memory:  Recent;   Fair  Judgement:  Fair  Insight:  Fair  Psychomotor Activity:  Normal  Concentration:  Concentration: Fair  Recall:  [employer] of Knowledge:  Fair  Language:  Good  Akathisia:  No  Handed:  Right  AIMS (if indicated):     Assets:  Housing Physical Health Resilience Social Support  ADL's:  Intact  Cognition:  WNL  Sleep:   WNL        Physical Exam: Physical Exam Vitals and nursing note reviewed.  HENT:     Head: Normocephalic and atraumatic.     Nose: Nose normal.  Pulmonary:     Effort: Pulmonary effort is normal.  Musculoskeletal:        General: Normal range of motion.     Cervical back: Normal range of motion.  Neurological:  Mental Status: She is alert and oriented to person, place, and time.  Psychiatric:        Attention and Perception: Attention normal.        Mood and Affect: Mood and affect normal.        Speech: Speech normal.        Behavior: Behavior is cooperative.        Thought Content: Thought content is not paranoid or delusional. Thought content does not include homicidal or suicidal ideation.        Cognition and Memory: Cognition normal.        Judgment: Judgment normal.    ROS Blood  pressure 108/81, pulse 60, temperature 98.4 F (36.9 C), temperature source Axillary, resp. rate 16, SpO2 100 %. There is no height or weight on file to calculate BMI.  Treatment Plan Summary: Patient cleared by psychiatry. No indication at patient experiencing crisis criteria and no reason to believe patient will benefit from inpatient psychiatric hospitalization. The patient's presentation is consistent with substance induced psychosis, likely secondary to reported use of crack cocaine, THC, and alcohol. Patient states she is not interested in substance abuse or detox treatment at this time. Plan reviewed with Dr. Jari Pigg, [place].  Disposition: No evidence of imminent risk to self or others at present.   Patient does not meet criteria for psychiatric inpatient admission. Supportive therapy provided about ongoing stressors. Discussed crisis plan, support from social network, calling 911, coming to the Emergency Department, and calling Suicide Hotline.  Ronny Flurry, NP 10/19/2022 1:39 PM

## 2022-10-19 NOTE — ED Notes (Signed)
Pt awake, snack provided.

## 2022-10-19 NOTE — ED Provider Notes (Signed)
Patient was reevaluated by psychiatry again this afternoon and she is back to her baseline self.  When evaluated her myself she denies any SI HI she does report using substances which I suspect was the cause of her altered mental status earlier.  She denies hearing voices or seeing things.  She reports feeling safe to go home.  She denies any interest in substance abuse programs.  We did discuss trying not to use these and she expressed understanding.  This current time she is not danger to self and is at her baseline self.  She is requesting a cooking to eat.   Vanessa Lynn, MD 10/19/22 1330

## 2022-10-19 NOTE — ED Notes (Signed)
Pt sleeping. 

## 2022-10-19 NOTE — ED Notes (Signed)
Report to andrea, rn ?

## 2023-09-05 ENCOUNTER — Other Ambulatory Visit: Payer: Self-pay

## 2023-09-05 DIAGNOSIS — Z20822 Contact with and (suspected) exposure to covid-19: Secondary | ICD-10-CM | POA: Insufficient documentation

## 2023-09-05 DIAGNOSIS — R112 Nausea with vomiting, unspecified: Secondary | ICD-10-CM | POA: Insufficient documentation

## 2023-09-05 DIAGNOSIS — N390 Urinary tract infection, site not specified: Secondary | ICD-10-CM | POA: Insufficient documentation

## 2023-09-05 DIAGNOSIS — R197 Diarrhea, unspecified: Secondary | ICD-10-CM | POA: Insufficient documentation

## 2023-09-05 LAB — COMPREHENSIVE METABOLIC PANEL
ALT: 11 U/L (ref 0–44)
AST: 14 U/L — ABNORMAL LOW (ref 15–41)
Albumin: 3.8 g/dL (ref 3.5–5.0)
Alkaline Phosphatase: 72 U/L (ref 38–126)
Anion gap: 10 (ref 5–15)
BUN: 14 mg/dL (ref 6–20)
CO2: 19 mmol/L — ABNORMAL LOW (ref 22–32)
Calcium: 8.5 mg/dL — ABNORMAL LOW (ref 8.9–10.3)
Chloride: 108 mmol/L (ref 98–111)
Creatinine, Ser: 0.96 mg/dL (ref 0.44–1.00)
GFR, Estimated: >60 mL/min (ref >60)
Glucose, Bld: 90 mg/dL (ref 70–99)
Potassium: 3.4 mmol/L — ABNORMAL LOW (ref 3.5–5.1)
Sodium: 137 mmol/L (ref 135–145)
Total Bilirubin: 0.4 mg/dL (ref 0.0–1.2)
Total Protein: 7.3 g/dL (ref 6.5–8.1)

## 2023-09-05 LAB — CBC
HCT: 43.9 % (ref 36.0–46.0)
Hemoglobin: 13.7 g/dL (ref 12.0–15.0)
MCH: 25.9 pg — ABNORMAL LOW (ref 26.0–34.0)
MCHC: 31.2 g/dL (ref 30.0–36.0)
MCV: 83.1 fL (ref 80.0–100.0)
Platelets: 280 10*3/uL (ref 150–400)
RBC: 5.28 MIL/uL — ABNORMAL HIGH (ref 3.87–5.11)
RDW: 16.3 % — ABNORMAL HIGH (ref 11.5–15.5)
WBC: 6.7 10*3/uL (ref 4.0–10.5)
nRBC: 0 % (ref 0.0–0.2)

## 2023-09-05 LAB — LIPASE, BLOOD: Lipase: 28 U/L (ref 11–51)

## 2023-09-05 NOTE — ED Triage Notes (Signed)
Pt to ED via [facility] c/o generalized abd pain x1 day. Endorses N/V/D. Denies fevers, CP, shob, dizziness

## 2023-09-06 ENCOUNTER — Emergency Department
Admission: EM | Admit: 2023-09-06 | Discharge: 2023-09-06 | Disposition: A | Discharge status: Home / Self Care | Payer: Self-pay | Attending: Emergency Medicine | Admitting: Emergency Medicine

## 2023-09-06 DIAGNOSIS — R112 Nausea with vomiting, unspecified: Secondary | ICD-10-CM

## 2023-09-06 DIAGNOSIS — N390 Urinary tract infection, site not specified: Secondary | ICD-10-CM

## 2023-09-06 LAB — URINALYSIS, ROUTINE W REFLEX MICROSCOPIC
Bilirubin Urine: NEGATIVE
Glucose, UA: NEGATIVE mg/dL
Hgb urine dipstick: NEGATIVE
Ketones, ur: 5 mg/dL — AB
Nitrite: POSITIVE — AB
Protein, ur: 30 mg/dL — AB
Specific Gravity, Urine: 1.036 — ABNORMAL HIGH (ref 1.005–1.030)
pH: 5 (ref 5.0–8.0)

## 2023-09-06 LAB — RESP PANEL BY RT-PCR (RSV, FLU A&B, COVID)  RVPGX2
Influenza A by PCR: NEGATIVE
Influenza B by PCR: NEGATIVE
Resp Syncytial Virus by PCR: NEGATIVE
SARS Coronavirus 2 by RT PCR: NEGATIVE

## 2023-09-06 LAB — POC URINE PREG, ED: Preg Test, Ur: NEGATIVE

## 2023-09-06 MED ORDER — NITROFURANTOIN MONOHYD MACRO 100 MG PO CAPS: 100.0000 mg | ORAL_CAPSULE | Freq: Two times a day (BID) | ORAL | 0 refills | Status: AC

## 2023-09-06 MED ORDER — ONDANSETRON HCL 4 MG PO TABS: 4.0000 mg | ORAL_TABLET | Freq: Three times a day (TID) | ORAL | 0 refills | Status: AC | PRN

## 2023-09-06 NOTE — ED Notes (Signed)
Pt able to tolerate crackers and water well without any problems.

## 2023-09-06 NOTE — ED Provider Notes (Signed)
[facility] Provider Note    Event Date/Time   First MD Initiated Contact with Patient 09/06/23 0222     (approximate)   History   Abdominal Pain   HPI  Casey Underwood is a 28 y.o. female  who presents to the emergency department today because of concern for nausea, vomiting and diarrhea, this has been accompanied by abdominal pain. Symptoms started yesterday. The patient denies any known sick contacts or unusual ingestions. Did not try any medication for the symptoms at home. Has not had any fevers.     Physical Exam   Triage Vital Signs: ED Triage Vitals  Encounter Vitals Group     BP 09/05/23 2126 122/69     Systolic BP Percentile --      Diastolic BP Percentile --      Pulse Rate 09/05/23 2126 98     Resp 09/05/23 2126 18     Temp 09/05/23 2126 98.5 F (36.9 C)     Temp Source 09/05/23 2126 Oral     SpO2 09/05/23 2126 100 %     Weight --      Height 09/05/23 2125 4\' 9"  (1.448 m)     Head Circumference --      Peak Flow --      Pain Score 09/05/23 2125 8     Pain Loc --      Pain Education --      Exclude from Growth Chart --     Most recent vital signs: Vitals:   09/05/23 2126 09/06/23 0224  BP: 122/69 (!) 128/97  Pulse: 98 72  Resp: 18 20  Temp: 98.5 F (36.9 C) 98.4 F (36.9 C)  SpO2: 100% 100%   General: Awake, alert, oriented. CV:  Good peripheral perfusion. Regular rate and rhythm. Resp:  Normal effort. Lungs clear. Abd:  No distention. Non tender.   ED Results / Procedures / Treatments   Labs (all labs ordered are listed, but only abnormal results are displayed) Labs Reviewed  COMPREHENSIVE METABOLIC PANEL - Abnormal; Notable for the following components:      Result Value   Potassium 3.4 (*)    CO2 19 (*)    Calcium 8.5 (*)    AST 14 (*)    All other components within normal limits  CBC - Abnormal; Notable for the following components:   RBC 5.28 (*)    MCH 25.9 (*)    RDW 16.3 (*)    All other components  within normal limits  POC URINE PREG, ED - Normal  RESP PANEL BY RT-PCR (RSV, FLU A&B, COVID)  RVPGX2  LIPASE, BLOOD  URINALYSIS, ROUTINE W REFLEX MICROSCOPIC  HCG, QUANTITATIVE, PREGNANCY     EKG  None   RADIOLOGY None   PROCEDURES:  Critical Care performed: No   MEDICATIONS ORDERED IN ED: Medications - No data to display   IMPRESSION / MDM / ASSESSMENT AND PLAN / ED COURSE  I reviewed the triage vital signs and the nursing notes.                              Differential diagnosis includes, but is not limited to, viral illness, appendicitis, ectopic pregnancy  Patient's presentation is most consistent with acute presentation with potential threat to life or bodily function.  Patient presented to the emergency department today because concerns for nausea vomiting and diarrhea accompanied by diffuse abdominal pain.  On  exam patient's abdomen is benign.  Patient is afebrile.  Blood work without concerning leukocytosis or electrolyte abnormality.  At this time I do think likely viral illness given description of symptoms.  Patient was able to tolerate p.o. here.  Workup also showed concerns for urinary tract infection.  Discussed this with the patient.  Will plan on discharging with prescription for nausea medication and antibiotics.     FINAL CLINICAL IMPRESSION(S) / ED DIAGNOSES   Final diagnoses:  Lower urinary tract infectious disease  Nausea vomiting and diarrhea     Note:  This document was prepared using Dragon voice recognition software and may include unintentional dictation errors.    Phineas Semen, MD 09/06/23 [phone]

## 2023-09-06 NOTE — Discharge Instructions (Addendum)
Please seek medical attention for any high fevers, chest pain, shortness of breath, change in behavior, persistent vomiting, bloody stool or any other new or concerning symptoms.

## 2023-09-06 NOTE — ED Notes (Signed)
Pt contact made and myself introduced. Pt is CAOx4 and breathing normally. Pt is lying in bed and complaining of 10/10 abdominal pain at this time. Pt complains of NVD as well.

## 2024-05-02 ENCOUNTER — Emergency Department
Admission: EM | Admit: 2024-05-02 | Discharge: 2024-05-02 | Disposition: A | Discharge status: Home / Self Care | Payer: Self-pay | Attending: Emergency Medicine | Admitting: Emergency Medicine

## 2024-05-02 ENCOUNTER — Other Ambulatory Visit: Payer: Self-pay

## 2024-05-02 DIAGNOSIS — L0291 Cutaneous abscess, unspecified: Secondary | ICD-10-CM

## 2024-05-02 DIAGNOSIS — L02416 Cutaneous abscess of left lower limb: Secondary | ICD-10-CM | POA: Insufficient documentation

## 2024-05-02 MED ORDER — DOXYCYCLINE HYCLATE 100 MG PO TABS
100.0000 mg | ORAL_TABLET | Freq: Two times a day (BID) | ORAL | 0 refills | Status: AC
Start: 2024-05-02 — End: 2024-05-12

## 2024-05-02 MED ORDER — IBUPROFEN 600 MG PO TABS: 600.0000 mg | ORAL_TABLET | Freq: Three times a day (TID) | ORAL | 0 refills | Status: AC | PRN

## 2024-05-02 MED ORDER — AMOXICILLIN-POT CLAVULANATE 875-125 MG PO TABS
1.0000 | ORAL_TABLET | Freq: Once | ORAL | Status: DC
  Filled 2024-05-02: qty 1

## 2024-05-02 MED ORDER — IBUPROFEN 600 MG PO TABS
600.0000 mg | ORAL_TABLET | Freq: Once | ORAL | Status: AC
  Administered 2024-05-02: 600 mg via ORAL
  Filled 2024-05-02: qty 1

## 2024-05-02 MED ORDER — DOXYCYCLINE HYCLATE 100 MG PO TABS
100.0000 mg | ORAL_TABLET | Freq: Once | ORAL | Status: AC
  Administered 2024-05-02: 100 mg via ORAL
  Filled 2024-05-02: qty 1

## 2024-05-02 NOTE — Discharge Instructions (Addendum)
 You have been diagnosed with abscess in the left thigh.  Please take doxycycline  1 tablet by mouth every 12 hours after main meals for 10 days.  You can take ibuprofen  1 tablet every 8 hours as needed for pain.  Please come back to ED if the lesion extends.  Please drink plenty fluids.  If you have new symptoms please come back to ED or go to your PCP.

## 2024-05-02 NOTE — ED Provider Notes (Addendum)
 [facility] Provider Note    Event Date/Time   First MD Initiated Contact with Patient 05/02/24 1851     (approximate)   History   Insect Bite    HPI  Casey Underwood is a 28 y.o. female    with a past medical history of UTI, bizarre behavior, prurigo nodularis, morgellon's  syndrome, polysubstance abuse, who presents to the ED complaining of   . According to the patient, symptoms started 2 days ago on her left tight, patient believes it was a insect bite.  Patient describes pain, warmness and erythema in the area.  Patient denies fever, chills.  Patient did drain obtaining purulent secretions.  Patient is here with a friend.  Patient requested food.     Patient Active Problem List   Diagnosis Date Noted   Bipolar 1 disorder (HCC) 10/31/2019   Psychoactive substance-induced psychosis (HCC) 10/31/2019   Alcohol abuse 10/31/2019   Cocaine abuse (HCC) 10/31/2019   Cannabis abuse 10/31/2019   BV (bacterial vaginosis) 04/28/2019   Pyelonephritis 04/28/2019     ROS: Patient currently denies any vision changes, tinnitus, difficulty speaking, facial droop, sore throat, chest pain, shortness of breath, abdominal pain, nausea/vomiting/diarrhea, dysuria, or weakness/numbness/paresthesias in any extremity   Physical Exam   Triage Vital Signs: ED Triage Vitals  Encounter Vitals Group     BP 05/02/24 1839 129/89     Girls Systolic BP Percentile --      Girls Diastolic BP Percentile --      Boys Systolic BP Percentile --      Boys Diastolic BP Percentile --      Pulse Rate 05/02/24 1839 (!) 110     Resp 05/02/24 1839 20     Temp 05/02/24 1839 98.9 F (37.2 C)     Temp src --      SpO2 05/02/24 1839 100 %     Weight 05/02/24 1840 110 lb (49.9 kg)     Height 05/02/24 1840 4' 9 (1.448 m)     Head Circumference --      Peak Flow --      Pain Score 05/02/24 1840 10     Pain Loc --      Pain Education --      Exclude from Growth Chart --     Most  recent vital signs: Vitals:   05/02/24 1839  BP: 129/89  Pulse: (!) 110  Resp: 20  Temp: 98.9 F (37.2 C)  SpO2: 100%     Physical Exam Vitals and nursing note reviewed.  During triage patient was tachycardic  Constitutional:      General: Awake and alert. No acute distress.    Appearance: Normal appearance. The patient is normal weight.      Able to speak in complete sentences without cough or dyspnea  HENT:     Head: Normocephalic and atraumatic.     Mouth: Mucous membranes are moist.  Eyes:     General: PERRL. Normal EOMs          Conjunctiva/sclera: Conjunctivae normal.  Nose No congestion/rhinorrhea  CV:                  Good peripheral perfusion.  Regular rate and rhythm  Resp:               Normal effort.  Equal breath sounds bilaterally.  Abd:                 No  distention.  Soft, nontender.  No rebound or guarding.  Musculoskeletal:        General: No swelling. Normal range of motion.  Left  thigh: Anterior medial third presence of punctuated lesion with several secretion, no fluctuance, warmness, tenderness.  About 7 cm.  Skin:    General: Skin is warm and dry.     Capillary Refill: Capillary refill takes less than 2 seconds.     Findings: No rash.  Neurological:     Mental Status: The patient is awake and alert. MAE spontaneously. No gross focal neurologic deficits are appreciated.  Psychiatric Mood and affect are normal. Speech and behavior are normal.       ED Results / Procedures / Treatments   Labs (all labs ordered are listed, but only abnormal results are displayed) Labs Reviewed - No data to display   PROCEDURES:  Critical Care performed:   Procedures   MEDICATIONS ORDERED IN ED: Medications  ibuprofen  (ADVIL ) tablet 600 mg (has no administration in time range)  doxycycline  (VIBRA -TABS) tablet 100 mg (has no administration in time range)      IMPRESSION / MDM / ASSESSMENT AND PLAN / ED COURSE  I reviewed the triage vital signs and  the nursing notes.  Differential diagnosis includes, but is not limited to, abscess, cellulitis, insect bite allergic reaction, unlikely foreign body  Patient's presentation is most consistent with acute, uncomplicated illness.   Casey Underwood is a 28 y.o., female with history of 2 days of left thigh insect bite with subsequent erythema, warmness, tenderness.  On physical exam left tight anterior medial third presence of punctuated  lesion with serous secretions, no fluctuance, indurated, warmth.  I did marked around for a follow-up.  Rest the physical exam is normal Patient's diagnosis is consistent with cellulitis with abscess. I did not order any imaging or labs, physical exam is reassuring.  I did review the patient's allergies and medications.The patient is in stable and satisfactory condition for discharge home  Patient will be discharged home with prescriptions for doxycycline , ibuprofen . Patient is to follow up with PCP as needed or otherwise directed. Patient is given ED precautions to return to the ED for any worsening or new symptoms. Discussed plan of care with patient, answered all of patient's questions, Patient agreeable to plan of care. Advised patient to take medications according to the instructions on the label. Discussed possible side effects of new medications. Patient verbalized understanding.    FINAL CLINICAL IMPRESSION(S) / ED DIAGNOSES   Final diagnoses:  Abscess     Rx / DC Orders   ED Discharge Orders          Ordered    doxycycline  (VIBRA -TABS) 100 MG tablet  2 times daily        05/02/24 1923    ibuprofen  (ADVIL ) 600 MG tablet  Every 8 hours PRN        05/02/24 1924             Note:  This document was prepared using Dragon voice recognition software and may include unintentional dictation errors.   Janit Kast, PA-C 05/02/24 1925    Janit Kast, PA-C 05/02/24 1925    Arlander Charleston, MD 05/02/24 1950

## 2024-05-02 NOTE — ED Notes (Signed)
 See triage note  Presents with family  Has possible spider bite to left thigh  States she noticed this 2 days ago  Area became worse today   Arera is red and swollen

## 2024-05-02 NOTE — ED Notes (Signed)
 At this time this EDT helped pt to the bathroom. Pt stated she couldn't walk but when I brought a wheelchair she had her family at bedside carry her into the wheelchair. Pt was able to ambulate in the bathroom independently. Pt is back in her room, call light within reach.

## 2024-05-02 NOTE — ED Triage Notes (Signed)
 Pt to ED for possible insect bite 2 days ago to left thigh. Redness and drainage noted.

## 2024-05-29 ENCOUNTER — Ambulatory Visit: Payer: Self-pay
